# Patient Record
Sex: Female | Born: 1990 | Race: Black or African American | Hispanic: No | Marital: Single | Smoking: Former smoker
Health system: Southern US, Community
[De-identification: ages and names within clinical notes are randomized; demographics above are authoritative.]

## PROBLEM LIST (undated history)

## (undated) ENCOUNTER — Inpatient Hospital Stay (HOSPITAL_COMMUNITY): Payer: Self-pay

## (undated) DIAGNOSIS — N200 Calculus of kidney: Secondary | ICD-10-CM

## (undated) DIAGNOSIS — A599 Trichomoniasis, unspecified: Secondary | ICD-10-CM

## (undated) HISTORY — PX: LITHOTRIPSY: SUR834

---
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
## 2011-03-28 ENCOUNTER — Emergency Department (HOSPITAL_BASED_OUTPATIENT_CLINIC_OR_DEPARTMENT_OTHER)
Admission: EM | Admit: 2011-03-28 | Discharge: 2011-03-28 | Disposition: A | Discharge status: Home / Self Care | Payer: Self-pay | Attending: Emergency Medicine | Admitting: Emergency Medicine

## 2011-03-28 DIAGNOSIS — J40 Bronchitis, not specified as acute or chronic: Secondary | ICD-10-CM | POA: Insufficient documentation

## 2011-07-21 ENCOUNTER — Encounter: Payer: Self-pay | Admitting: *Deleted

## 2011-07-21 ENCOUNTER — Emergency Department (HOSPITAL_BASED_OUTPATIENT_CLINIC_OR_DEPARTMENT_OTHER)
Admission: EM | Admit: 2011-07-21 | Discharge: 2011-07-21 | Disposition: A | Discharge status: Home / Self Care | Payer: Self-pay | Attending: Emergency Medicine | Admitting: Emergency Medicine

## 2011-07-21 DIAGNOSIS — F17200 Nicotine dependence, unspecified, uncomplicated: Secondary | ICD-10-CM | POA: Insufficient documentation

## 2011-07-21 DIAGNOSIS — L509 Urticaria, unspecified: Secondary | ICD-10-CM | POA: Insufficient documentation

## 2011-07-21 HISTORY — DX: Calculus of kidney: N20.0

## 2011-07-21 MED ORDER — DIPHENHYDRAMINE HCL 50 MG/ML IJ SOLN
25.0000 mg | Freq: Once | INTRAMUSCULAR | Status: AC
  Administered 2011-07-21: 50 mg via INTRAVENOUS
  Filled 2011-07-21: qty 1

## 2011-07-21 MED ORDER — FAMOTIDINE IN NACL 20-0.9 MG/50ML-% IV SOLN
20.0000 mg | Freq: Once | INTRAVENOUS | Status: AC
  Administered 2011-07-21: 14:00:00 via INTRAVENOUS
  Administered 2011-07-21: 20 mg via INTRAVENOUS
  Filled 2011-07-21: qty 50

## 2011-07-21 MED ORDER — METHYLPREDNISOLONE SODIUM SUCC 125 MG IJ SOLR
125.0000 mg | Freq: Once | INTRAMUSCULAR | Status: AC
  Administered 2011-07-21: 125 mg via INTRAVENOUS
  Filled 2011-07-21: qty 2

## 2011-07-21 MED ORDER — STERILE WATER FOR INJECTION IV SOLN
INTRAVENOUS | Status: DC
  Administered 2011-07-21 (×2): via INTRAVENOUS

## 2011-07-21 NOTE — ED Notes (Signed)
Pt c/o rash to entire body x 2 days 

## 2011-07-21 NOTE — ED Provider Notes (Signed)
Medical screening examination/treatment/procedure(s) were performed by non-physician practitioner and as supervising physician I was immediately available for consultation/collaboration.    Lyanne Co, MD 07/21/11 [phone]

## 2011-07-21 NOTE — ED Provider Notes (Signed)
History     CSN: [id number] Arrival date & time: 07/21/2011  1:38 PM  Chief Complaint  Patient presents with  . Rash   Patient is a 20 y.o. female presenting with rash. The history is provided by the patient.  Rash  This is a new problem. The current episode started yesterday. The problem has not changed since onset.There has been no fever. The rash is present on the neck, face and right arm. The patient is experiencing no pain. The pain has been constant since onset. Pertinent negatives include no itching. She has tried nothing for the symptoms. Risk factors include new environmental exposures.   Pt has a rash,  Worse on arms and face.  Pt denies any insect bites Past Medical History  Diagnosis Date  . Kidney calculi     Past Surgical History  Procedure Date  . Lithotripsy     History reviewed. No pertinent family history.  History  Substance Use Topics  . Smoking status: Current Some Day Smoker -- 0.5 packs/day  . Smokeless tobacco: Not on file  . Alcohol Use: No    OB History    Grav Para Term Preterm Abortions TAB SAB Ect Mult Living                  Review of Systems  Skin: Positive for rash. Negative for itching.  All other systems reviewed and are negative.    Physical Exam  BP 117/71  Pulse 62  Temp(Src) 98.4 F (36.9 C) (Oral)  Resp 16  Wt 170 lb (77.111 kg)  LMP 07/13/2011  Physical Exam  Nursing note and vitals reviewed. Constitutional: She appears well-developed and well-nourished.  HENT:  Head: Normocephalic and atraumatic.  Eyes: Conjunctivae and EOM are normal. Pupils are equal, round, and reactive to light.  Neck: Normal range of motion. Neck supple.  Cardiovascular: Normal rate.   Pulmonary/Chest: Effort normal.  Abdominal: Soft.  Musculoskeletal: Normal range of motion.  Neurological: She is alert.  Skin: Rash noted.  Psychiatric: She has a normal mood and affect.  Pt has a hive like rash,  Bright red areas  ED Course   Procedures  MDM Pt is here with a friend who has multiple insect bites.      Langston Masker, [place] 07/21/11 1537

## 2011-08-25 LAB — WET PREP, GENITAL
Trich, Wet Prep: NONE SEEN
Yeast Wet Prep HPF POC: NONE SEEN

## 2011-08-25 LAB — POCT URINALYSIS DIP (DEVICE)
Bilirubin Urine: NEGATIVE
Glucose, UA: NEGATIVE
Ketones, ur: NEGATIVE
Nitrite: NEGATIVE
Specific Gravity, Urine: 1.02
pH: 7

## 2011-08-25 LAB — GC/CHLAMYDIA PROBE AMP, GENITAL: Chlamydia, DNA Probe: POSITIVE — AB

## 2012-06-06 ENCOUNTER — Encounter (HOSPITAL_BASED_OUTPATIENT_CLINIC_OR_DEPARTMENT_OTHER): Payer: Self-pay | Admitting: *Deleted

## 2012-06-06 ENCOUNTER — Emergency Department (HOSPITAL_BASED_OUTPATIENT_CLINIC_OR_DEPARTMENT_OTHER)
Admission: EM | Admit: 2012-06-06 | Discharge: 2012-06-06 | Disposition: A | Discharge status: Home / Self Care | Payer: Self-pay | Attending: Emergency Medicine | Admitting: Emergency Medicine

## 2012-06-06 DIAGNOSIS — T148 Other injury of unspecified body region: Secondary | ICD-10-CM | POA: Insufficient documentation

## 2012-06-06 DIAGNOSIS — W57XXXA Bitten or stung by nonvenomous insect and other nonvenomous arthropods, initial encounter: Secondary | ICD-10-CM | POA: Insufficient documentation

## 2012-06-06 MED ORDER — HYDROXYZINE HCL 25 MG PO TABS
25.0000 mg | ORAL_TABLET | Freq: Once | ORAL | Status: AC
  Administered 2012-06-06: 25 mg via ORAL
  Filled 2012-06-06: qty 1

## 2012-06-06 MED ORDER — HYDROXYZINE HCL 25 MG PO TABS: 25.0000 mg | ORAL_TABLET | Freq: Three times a day (TID) | ORAL | Status: AC | PRN

## 2012-06-06 NOTE — ED Notes (Signed)
Ice pack applied.

## 2012-06-06 NOTE — ED Provider Notes (Signed)
History  This chart was scribed for Cyndra Numbers, MD by Erskine Emery. This patient was seen in room MH06/MH06 and the patient's care was started at 22:18.  CSN: [id number]  Arrival date & time 06/06/12  2203   First MD Initiated Contact with Patient 06/06/12 2218      Chief Complaint  Patient presents with  . Urticaria    (Consider location/radiation/quality/duration/timing/severity/associated sxs/prior treatment) HPI  Sandra [name] is a 21 y.o. female who presents to the Emergency Department complaining of an itching rash located on her buttocks, back, LUE, and LLE with associated swelling and pain. Pt denies any associated nausea, vomiting, fevers, or trouble swallowing. Pt reports taking Benadryl at 11am this morning and using cortisone itch cream with no relief of symptoms. Pt reports she spent the night at her grandmother's house and woke up to a rash all over her body. Pt reports she was the only one in the household to wake up with these symptoms. Pt denies her grandmother leaves the windows open and says she has no dog. Pt was driven to ED by a friend. Pt reports no previous episodes of inflamed reactions to insect bites. Pt denies any new topical exposures or environmental exposures.     Past Medical History  Diagnosis Date  . Kidney calculi     Past Surgical History  Procedure Date  . Lithotripsy     History reviewed. No pertinent family history.  History  Substance Use Topics  . Smoking status: Current Some Day Smoker -- 0.5 packs/day  . Smokeless tobacco: Not on file  . Alcohol Use: No    OB History    Grav Para Term Preterm Abortions TAB SAB Ect Mult Living                  Review of Systems  Constitutional: Negative.  Negative for fever.  HENT: Negative.  Negative for trouble swallowing.   Eyes: Negative.   Respiratory: Negative.   Cardiovascular: Negative.   Gastrointestinal: Negative.  Negative for nausea and vomiting.  Genitourinary: Negative.     Musculoskeletal: Negative.   Skin: Positive for rash.  Neurological: Negative.   Hematological: Negative.   Psychiatric/Behavioral: Negative.   All other systems reviewed and are negative.   A complete 10 system review of systems was obtained and all systems are negative except as noted in the HPI and PMH.   Allergies  Review of patient's allergies indicates no known allergies.  Home Medications  No current outpatient prescriptions on file.  Triage Vitals: BP 112/84  Pulse 91  Temp 98.2 F (36.8 C) (Oral)  Resp 20  Ht 5\' 1"  (1.549 m)  Wt 175 lb (79.379 kg)  BMI 33.07 kg/m2  SpO2 100%  LMP 05/30/2012  Physical Exam  Nursing note and vitals reviewed. GEN: Well-developed, well-nourished female in no distress HEENT: Atraumatic, normocephalic.  EYES: PERRLA BL, no scleral icterus. NECK: Trachea midline, no meningismus CV: regular rate and rhythm.  PULM: No respiratory distress.   GI: soft, non-tender. No guarding, rebound, or tenderness. + bowel sounds  GU: deferred Neuro: cranial nerves grossly 2-12 intact, no abnormalities of strength or sensation, A and O x 3 MSK: Patient moves all 4 extremities symmetrically, no deformity, edema, or injury noted Skin: Patient with numerous erythematous lesions with raised 1 cm central lesions consistent with insect bites.  These are noted on the right lower extremity and right upper extremity as well as her right shoulder. Psych: no abnormality of mood  ED Course  Procedures (including critical care time)  DIAGNOSTIC STUDIES: Oxygen Saturation is 100% on room air, normal by my interpretation.    COORDINATION OF CARE: 22:31--I discussed treatment plan including Atarax with pt and pt agreed.  22:30--Medication order: hydroxyzine (Atrax/Vistaril) tablet 25 mg--once  Labs Reviewed - No data to display No results found.   1. Multiple insect bites       MDM  Patient was evaluated by myself. Based on evaluation patient had  numerous bug bites most consistent with mosquito bites. Patient not have any distribution or lesions to suggest a cellulitis violation. Every lesion appeared to have small erythematous raised central area consistent with insect bite. Patient was treated with Atarax here and had improvement in her itching. Patient also was given ice packs and had improvement as well. Patient was discharged in good condition with prescription for Atarax. I no concern for possible abscesses his lesions had fluctuance or induration. Patient had no known allergy exposures. Patient was comfortable with plan and discharged in good condition.  I personally performed the services described in this documentation, which was scribed in my presence. The recorded information has been reviewed and considered.         Cyndra Numbers, MD 06/06/12 [phone]

## 2012-06-06 NOTE — ED Notes (Signed)
Pt has large red whelps to arms, back, and legs since yesterday. Pt states no relief from hydrocortisone cream or benadryl.

## 2013-10-09 ENCOUNTER — Emergency Department (HOSPITAL_BASED_OUTPATIENT_CLINIC_OR_DEPARTMENT_OTHER)
Admission: EM | Admit: 2013-10-09 | Discharge: 2013-10-09 | Disposition: A | Discharge status: Home / Self Care | Payer: Medicaid Other | Attending: Emergency Medicine | Admitting: Emergency Medicine

## 2013-10-09 ENCOUNTER — Encounter (HOSPITAL_BASED_OUTPATIENT_CLINIC_OR_DEPARTMENT_OTHER): Payer: Self-pay | Admitting: Emergency Medicine

## 2013-10-09 DIAGNOSIS — Z3202 Encounter for pregnancy test, result negative: Secondary | ICD-10-CM | POA: Insufficient documentation

## 2013-10-09 DIAGNOSIS — F17200 Nicotine dependence, unspecified, uncomplicated: Secondary | ICD-10-CM | POA: Insufficient documentation

## 2013-10-09 DIAGNOSIS — N63 Unspecified lump in unspecified breast: Secondary | ICD-10-CM | POA: Insufficient documentation

## 2013-10-09 DIAGNOSIS — Z87442 Personal history of urinary calculi: Secondary | ICD-10-CM | POA: Insufficient documentation

## 2013-10-09 MED ORDER — IBUPROFEN 800 MG PO TABS
800.0000 mg | ORAL_TABLET | Freq: Once | ORAL | Status: AC
  Administered 2013-10-09: 800 mg via ORAL
  Filled 2013-10-09: qty 1

## 2013-10-09 NOTE — ED Notes (Signed)
Reports right breast pain x 1 week. Noticed new lump in right breast 1 week ago

## 2013-10-09 NOTE — ED Provider Notes (Signed)
CSN: [id number]     Arrival date & time 10/09/13  1816 History   First MD Initiated Contact with Patient 10/09/13 1921     Chief Complaint  Patient presents with  . Breast Mass   (Consider location/radiation/quality/duration/timing/severity/associated sxs/prior Treatment) The history is provided by the patient. No language interpreter was used.  Pt is a 22 year old female who presents with a lump in her right breast. She reports that it has been there for approx 1 week and she noticed it while doing a self-breast exam. She reports that it is tender to touch. She denies fever, chillls, nausea, vomiting or recent illness. She denies any history of fibrocystic breast disease or breast cancer in her family. She says that her sister had a benign cyst in her breast and had it removed. She reports having her LMP approx 2 weeks ago and has had regular cycles every month. She denies any warmth or erythema, she is not lactating.    Past Medical History  Diagnosis Date  . Kidney calculi    Past Surgical History  Procedure Laterality Date  . Lithotripsy     No family history on file. History  Substance Use Topics  . Smoking status: Current Some Day Smoker -- 0.50 packs/day    Types: Cigarettes  . Smokeless tobacco: Never Used  . Alcohol Use: No   OB History   Grav Para Term Preterm Abortions TAB SAB Ect Mult Living                 Review of Systems  Constitutional: Negative for fever and chills.  Cardiovascular: Negative for chest pain.  Gastrointestinal: Negative for nausea, vomiting and diarrhea.  All other systems reviewed and are negative.    Allergies  Ciprofloxacin  Home Medications  No current outpatient prescriptions on file. BP 126/77  Pulse 77  Temp(Src) 99.1 F (37.3 C) (Oral)  Resp 20  Ht 5\' 2"  (1.575 m)  Wt 149 lb 11.2 oz (67.903 kg)  BMI 27.37 kg/m2  SpO2 100%  LMP 09/23/2013 Physical Exam  Vitals reviewed. Constitutional: She is oriented to person, place,  and time. She appears well-developed and well-nourished.  HENT:  Head: Normocephalic and atraumatic.  Mouth/Throat: Oropharynx is clear and moist.  Eyes: Conjunctivae and EOM are normal. Pupils are equal, round, and reactive to light.  Neck: Normal range of motion. Neck supple.  Cardiovascular: Normal rate, regular rhythm and normal heart sounds.   Pulmonary/Chest: Effort normal and breath sounds normal. No respiratory distress. She has no wheezes.  Abdominal: Soft. Bowel sounds are normal. She exhibits no distension. There is no tenderness.  Musculoskeletal: Normal range of motion.  Neurological: She is alert and oriented to person, place, and time.  Skin: Skin is warm and dry.     Psychiatric: She has a normal mood and affect. Her behavior is normal. Judgment and thought content normal.    ED Course  Procedures (including critical care time) Labs Review Labs Reviewed  PREGNANCY, URINE   Imaging Review No results found.  EKG Interpretation   None       MDM   1. Breast mass in female    2-3cm, round, well-defined, non-fluctuant area at 5 o'clock, right breast. Tender to palpation. No fever, chills, warmth in the area. No discharge from nipple or dimpling noted. Ibuprofen for pain. Follow-up: establish PCP, breast center for diagnostic mammogram vs. Ultrasound. No history of breast cancer in her family, sister had cystic breast. Most likely a macrocyst.  Irish Elders, NP 10/09/13 2001

## 2013-10-09 NOTE — ED Notes (Signed)
Patient c/o pain in her right breast with movement, states that she feels as though there is lump in her breast and that is where she thinks the pain is radiating from.

## 2013-10-12 NOTE — ED Provider Notes (Signed)
Medical screening examination/treatment/procedure(s) were performed by non-physician practitioner and as supervising physician I was immediately available for consultation/collaboration.  EKG Interpretation   None         Launa Goedken W. Makinsey Pepitone, MD 10/12/13 0641 

## 2014-02-24 ENCOUNTER — Encounter (HOSPITAL_COMMUNITY): Payer: Self-pay | Admitting: *Deleted

## 2014-02-24 ENCOUNTER — Inpatient Hospital Stay (HOSPITAL_COMMUNITY): Payer: Medicaid Other

## 2014-02-24 ENCOUNTER — Inpatient Hospital Stay (HOSPITAL_COMMUNITY)
Admission: AD | Admit: 2014-02-24 | Discharge: 2014-02-24 | Disposition: A | Discharge status: Home / Self Care | Payer: Medicaid Other | Source: Ambulatory Visit | Attending: Obstetrics & Gynecology | Admitting: Obstetrics & Gynecology

## 2014-02-24 DIAGNOSIS — O2390 Unspecified genitourinary tract infection in pregnancy, unspecified trimester: Secondary | ICD-10-CM | POA: Insufficient documentation

## 2014-02-24 DIAGNOSIS — A499 Bacterial infection, unspecified: Secondary | ICD-10-CM

## 2014-02-24 DIAGNOSIS — B9689 Other specified bacterial agents as the cause of diseases classified elsewhere: Secondary | ICD-10-CM | POA: Insufficient documentation

## 2014-02-24 DIAGNOSIS — N760 Acute vaginitis: Secondary | ICD-10-CM | POA: Insufficient documentation

## 2014-02-24 DIAGNOSIS — O99330 Smoking (tobacco) complicating pregnancy, unspecified trimester: Secondary | ICD-10-CM | POA: Insufficient documentation

## 2014-02-24 DIAGNOSIS — R109 Unspecified abdominal pain: Secondary | ICD-10-CM | POA: Insufficient documentation

## 2014-02-24 DIAGNOSIS — O26899 Other specified pregnancy related conditions, unspecified trimester: Secondary | ICD-10-CM

## 2014-02-24 HISTORY — DX: Trichomoniasis, unspecified: A59.9

## 2014-02-24 LAB — URINALYSIS, ROUTINE W REFLEX MICROSCOPIC
Bilirubin Urine: NEGATIVE
GLUCOSE, UA: NEGATIVE mg/dL
HGB URINE DIPSTICK: NEGATIVE
Ketones, ur: NEGATIVE mg/dL
LEUKOCYTES UA: NEGATIVE
Nitrite: NEGATIVE
Protein, ur: NEGATIVE mg/dL
SPECIFIC GRAVITY, URINE: 1.01 (ref 1.005–1.030)
UROBILINOGEN UA: 0.2 mg/dL (ref 0.0–1.0)
pH: 7 (ref 5.0–8.0)

## 2014-02-24 LAB — CBC
HEMATOCRIT: 38.5 % (ref 36.0–46.0)
Hemoglobin: 13.4 g/dL (ref 12.0–15.0)
MCH: 34.4 pg — ABNORMAL HIGH (ref 26.0–34.0)
MCHC: 34.8 g/dL (ref 30.0–36.0)
MCV: 98.7 fL (ref 78.0–100.0)
PLATELETS: 333 10*3/uL (ref 150–400)
RBC: 3.9 MIL/uL (ref 3.87–5.11)
RDW: 13 % (ref 11.5–15.5)
WBC: 11 10*3/uL — AB (ref 4.0–10.5)

## 2014-02-24 LAB — HCG, QUANTITATIVE, PREGNANCY: hCG, Beta Chain, Quant, S: 15998 m[IU]/mL — ABNORMAL HIGH (ref <5)

## 2014-02-24 LAB — WET PREP, GENITAL
TRICH WET PREP: NONE SEEN
YEAST WET PREP: NONE SEEN

## 2014-02-24 MED ORDER — METRONIDAZOLE 500 MG PO TABS: 500.0000 mg | ORAL_TABLET | Freq: Two times a day (BID) | ORAL | Status: DC

## 2014-02-24 NOTE — Discharge Instructions (Signed)
Bacterial Vaginosis Bacterial vaginosis is a vaginal infection that occurs when the normal balance of bacteria in the vagina is disrupted. It results from an overgrowth of certain bacteria. This is the most common vaginal infection in women of childbearing age. Treatment is important to prevent complications, especially in pregnant women, as it can cause a premature delivery. CAUSES  Bacterial vaginosis is caused by an increase in harmful bacteria that are normally present in smaller amounts in the vagina. Several different kinds of bacteria can cause bacterial vaginosis. However, the reason that the condition develops is not fully understood. RISK FACTORS Certain activities or behaviors can put you at an increased risk of developing bacterial vaginosis, including:  Having a new sex partner or multiple sex partners.  Douching.  Using an intrauterine device (IUD) for contraception. Women do not get bacterial vaginosis from toilet seats, bedding, swimming pools, or contact with objects around them. SIGNS AND SYMPTOMS  Some women with bacterial vaginosis have no signs or symptoms. Common symptoms include:  Grey vaginal discharge.  A fishlike odor with discharge, especially after sexual intercourse.  Itching or burning of the vagina and vulva.  Burning or pain with urination. DIAGNOSIS  Your health care provider will take a medical history and examine the vagina for signs of bacterial vaginosis. A sample of vaginal fluid may be taken. Your health care provider will look at this sample under a microscope to check for bacteria and abnormal cells. A vaginal pH test may also be done.  TREATMENT  Bacterial vaginosis may be treated with antibiotic medicines. These may be given in the form of a pill or a vaginal cream. A second round of antibiotics may be prescribed if the condition comes back after treatment.  HOME CARE INSTRUCTIONS   Only take over-the-counter or prescription medicines as  directed by your health care provider.  If antibiotic medicine was prescribed, take it as directed. Make sure you finish it even if you start to feel better.  Do not have sex until treatment is completed.  Tell all sexual partners that you have a vaginal infection. They should see their health care provider and be treated if they have problems, such as a mild rash or itching.  Practice safe sex by using condoms and only having one sex partner. SEEK MEDICAL CARE IF:   Your symptoms are not improving after 3 days of treatment.  You have increased discharge or pain.  You have a fever. MAKE SURE YOU:   Understand these instructions.  Will watch your condition.  Will get help right away if you are not doing well or get worse. FOR MORE INFORMATION  Centers for Disease Control and Prevention, Division of STD Prevention: [employer] American Sexual Health Association (ASHA): www.ashastd.org  Document Released: 11/17/2005 Document Revised: 09/07/2013 Document Reviewed: 06/29/2013 [facility] Patient Information 2014 [place].  Pregnancy - First Trimester During sexual intercourse, millions of sperm go into the vagina. Only 1 sperm will penetrate and fertilize the female egg while it is in the Fallopian tube. One week later, the fertilized egg implants into the wall of the uterus. An embryo begins to develop into a baby. At 6 to 8 weeks, the eyes and face are formed and the heartbeat can be seen on ultrasound. At the end of 12 weeks (first trimester), all the baby's organs are formed. Now that you are pregnant, you will want to do everything you can to have a healthy baby. Two of the most important things are to get good  prenatal care and follow your caregiver's instructions. Prenatal care is all the medical care you receive before the baby's birth. It is given to prevent, find, and treat problems during the pregnancy and childbirth. PRENATAL EXAMS  During prenatal visits, your weight,  blood pressure, and urine are checked. This is done to make sure you are healthy and progressing normally during the pregnancy.  A pregnant woman should gain 25 to 35 pounds during the pregnancy. However, if you are overweight or underweight, your caregiver will advise you regarding your weight.  Your caregiver will ask and answer questions for you.  Blood work, cervical cultures, other necessary tests, and a Pap test are done during your prenatal exams. These tests are done to check on your health and the probable health of your baby. Tests are strongly recommended and done for HIV with your permission. This is the virus that causes AIDS. These tests are done because medicines can be given to help prevent your baby from being born with this infection should you have been infected without knowing it. Blood work is also used to find out your blood type, previous infections, and follow your blood levels (hemoglobin).  Low hemoglobin (anemia) is common during pregnancy. Iron and vitamins are given to help prevent this. Later in the pregnancy, blood tests for diabetes will be done along with any other tests if any problems develop.  You may need other tests to make sure you and the baby are doing well. CHANGES DURING THE FIRST TRIMESTER  Your body goes through many changes during pregnancy. They vary from person to person. Talk to your caregiver about changes you notice and are concerned about. Changes can include:  Your menstrual period stops.  The egg and sperm carry the genes that determine what you look like. Genes from you and your partner are forming a baby. The female genes determine whether the baby is a boy or a girl.  Your body increases in girth and you may feel bloated.  Feeling sick to your stomach (nauseous) and throwing up (vomiting). If the vomiting is uncontrollable, call your caregiver.  Your breasts will begin to enlarge and become tender.  Your nipples may stick out more and  become darker.  The need to urinate more. Painful urination may mean you have a bladder infection.  Tiring easily.  Loss of appetite.  Cravings for certain kinds of food.  At first, you may gain or lose a couple of pounds.  You may have changes in your emotions from day to day (excited to be pregnant or concerned something may go wrong with the pregnancy and baby).  You may have more vivid and strange dreams. HOME CARE INSTRUCTIONS   It is very important to avoid all smoking, alcohol and non-prescribed drugs during your pregnancy. These affect the formation and growth of the baby. Avoid chemicals while pregnant to ensure the delivery of a healthy infant.  Start your prenatal visits by the 12th week of pregnancy. They are usually scheduled monthly at first, then more often in the last 2 months before delivery. Keep your caregiver's appointments. Follow your caregiver's instructions regarding medicine use, blood and lab tests, exercise, and diet.  During pregnancy, you are providing food for you and your baby. Eat regular, well-balanced meals. Choose foods such as meat, fish, milk and other low fat dairy products, vegetables, fruits, and whole-grain breads and cereals. Your caregiver will tell you of the ideal weight gain.  You can help morning sickness by keeping  soda crackers at the bedside. Eat a couple before arising in the morning. You may want to use the crackers without salt on them.  Eating 4 to 5 small meals rather than 3 large meals a day also may help the nausea and vomiting.  Drinking liquids between meals instead of during meals also seems to help nausea and vomiting.  A physical sexual relationship may be continued throughout pregnancy if there are no other problems. Problems may be early (premature) leaking of amniotic fluid from the membranes, vaginal bleeding, or belly (abdominal) pain.  Exercise regularly if there are no restrictions. Check with your caregiver or  physical therapist if you are unsure of the safety of some of your exercises. Greater weight gain will occur in the last 2 trimesters of pregnancy. Exercising will help:  Control your weight.  Keep you in shape.  Prepare you for labor and delivery.  Help you lose your pregnancy weight after you deliver your baby.  Wear a good support or jogging bra for breast tenderness during pregnancy. This may help if worn during sleep too.  Ask when prenatal classes are available. Begin classes when they are offered.  Do not use hot tubs, steam rooms, or saunas.  Wear your seat belt when driving. This protects you and your baby if you are in an accident.  Avoid raw meat, uncooked cheese, cat litter boxes, and soil used by cats throughout the pregnancy. These carry germs that can cause birth defects in the baby.  The first trimester is a good time to visit your dentist for your dental health. Getting your teeth cleaned is okay. Use a softer toothbrush and brush gently during pregnancy.  Ask for help if you have financial, counseling, or nutritional needs during pregnancy. Your caregiver will be able to offer counseling for these needs as well as refer you for other special needs.  Do not take any medicines or herbs unless told by your caregiver.  Inform your caregiver if there is any mental or physical domestic violence.  Make a list of emergency phone numbers of family, friends, hospital, and police and fire departments.  Write down your questions. Take them to your prenatal visit.  Do not douche.  Do not cross your legs.  If you have to stand for long periods of time, rotate you feet or take small steps in a circle.  You may have more vaginal secretions that may require a sanitary pad. Do not use tampons or scented sanitary pads. MEDICINES AND DRUG USE IN PREGNANCY  Take prenatal vitamins as directed. The vitamin should contain 1 milligram of folic acid. Keep all vitamins out of reach of  children. Only a couple vitamins or tablets containing iron may be fatal to a baby or young child when ingested.  Avoid use of all medicines, including herbs, over-the-counter medicines, not prescribed or suggested by your caregiver. Only take over-the-counter or prescription medicines for pain, discomfort, or fever as directed by your caregiver. Do not use aspirin, ibuprofen, or naproxen unless directed by your caregiver.  Let your caregiver also know about herbs you may be using.  Alcohol is related to a number of birth defects. This includes fetal alcohol syndrome. All alcohol, in any form, should be avoided completely. Smoking will cause low birth rate and premature babies.  Street or illegal drugs are very harmful to the baby. They are absolutely forbidden. A baby born to an addicted mother will be addicted at birth. The baby will go through the  same withdrawal an adult does. °· Let your caregiver know about any medicines that you have to take and for what reason you take them. °SEEK MEDICAL CARE IF:  °You have any concerns or worries during your pregnancy. It is better to call with your questions if you feel they cannot wait, rather than worry about them. °SEEK IMMEDIATE MEDICAL CARE IF:  °· An unexplained oral temperature above 102° F (38.9° C) develops, or as your caregiver suggests. °· You have leaking of fluid from the vagina (birth canal). If leaking membranes are suspected, take your temperature and inform your caregiver of this when you call. °· There is vaginal spotting or bleeding. Notify your caregiver of the amount and how many pads are used. °· You develop a bad smelling vaginal discharge with a change in the color. °· You continue to feel sick to your stomach (nauseated) and have no relief from remedies suggested. You vomit blood or coffee ground-like materials. °· You lose more than 2 pounds of weight in 1 week. °· You gain more than 2 pounds of weight in 1 week and you notice swelling of  your face, hands, feet, or legs. °· You gain 5 pounds or more in 1 week (even if you do not have swelling of your hands, face, legs, or feet). °· You get exposed to German measles and have never had them. °· You are exposed to fifth disease or chickenpox. °· You develop belly (abdominal) pain. Round ligament discomfort is a common non-cancerous (benign) cause of abdominal pain in pregnancy. Your caregiver still must evaluate this. °· You develop headache, fever, diarrhea, pain with urination, or shortness of breath. °· You fall or are in a car accident or have any kind of trauma. °· There is mental or physical violence in your home. °Document Released: 11/11/2001 Document Revised: 08/11/2012 Document Reviewed: 05/15/2009 °[employer]® Patient Information ©2014 [employer]. ° ° °

## 2014-02-24 NOTE — MAU Note (Signed)
Pt presents with complaints of lower abdominal pain for a couple of weeks. She states she thought she was getting ready to start her cycle but never did and took 2 home pregnancy test with one positive and one negative.

## 2014-02-24 NOTE — MAU Provider Note (Signed)
Chief Complaint: Abdominal Pain   First Provider Initiated Contact with Patient 02/24/14 1825     SUBJECTIVE HPI: Sandra [name] is a 23 y.o. G0P0 at who presents to maternity admissions reporting late menses and abdominal pain x2 weeks.  She reports 1 positive pregnancy test 1 week ago and 1 equivocal/negative test 4 days ago.  Patient's last menstrual period was 01/13/2014.  She reports having a bowel movement yesterday that was hard and uncomfortable, which is common for her.  She denies vaginal bleeding, vaginal itching/burning, urinary symptoms, h/a, dizziness, n/v, or fever/chills.     Past Medical History  Diagnosis Date  . Kidney calculi   . Trichomonas infection    Past Surgical History  Procedure Laterality Date  . Lithotripsy     History   Social History  . Marital Status: Single    Spouse Name: N/A    Number of Children: N/A  . Years of Education: N/A   Occupational History  . Not on file.   Social History Main Topics  . Smoking status: Current Some Day Smoker -- 0.50 packs/day    Types: Cigarettes  . Smokeless tobacco: Never Used  . Alcohol Use: No  . Drug Use: No  . Sexual Activity: Yes    Birth Control/ Protection: Condom   Other Topics Concern  . Not on file   Social History Narrative  . No narrative on file   No current facility-administered medications on file prior to encounter.   No current outpatient prescriptions on file prior to encounter.   Allergies  Allergen Reactions  . Ciprofloxacin Hives    ROS: Pertinent items in HPI  OBJECTIVE Blood pressure 122/64, pulse 102, temperature 98.2 F (36.8 C), temperature source Oral, resp. rate 16, height 5\' 2"  (1.575 m), weight 69.4 kg (153 lb), last menstrual period 01/13/2014. GENERAL: Well-developed, well-nourished female in no acute distress.  HEENT: Normocephalic HEART: normal rate RESP: normal effort ABDOMEN: Soft, non-tender EXTREMITIES: Nontender, no edema NEURO: Alert and  oriented Pelvic exam: Cervix pink, visually closed, without lesion, moderate amount frothy yellow/white discharge, vaginal walls and external genitalia normal Bimanual exam: Cervix 0/long/high, firm, anterior, neg CMT, uterus nontender, nonenlarged, adnexa without tenderness, enlargement, or mass  LAB RESULTS Results for orders placed during the hospital encounter of 02/24/14 (from the past 24 hour(s))  URINALYSIS, ROUTINE W REFLEX MICROSCOPIC     Status: None   Collection Time    02/24/14  5:00 PM      Result Value Ref Range   Color, Urine YELLOW  YELLOW   APPearance CLEAR  CLEAR   Specific Gravity, Urine 1.010  1.005 - 1.030   pH 7.0  5.0 - 8.0   Glucose, UA NEGATIVE  NEGATIVE mg/dL   Hgb urine dipstick NEGATIVE  NEGATIVE   Bilirubin Urine NEGATIVE  NEGATIVE   Ketones, ur NEGATIVE  NEGATIVE mg/dL   Protein, ur NEGATIVE  NEGATIVE mg/dL   Urobilinogen, UA 0.2  0.0 - 1.0 mg/dL   Nitrite NEGATIVE  NEGATIVE   Leukocytes, UA NEGATIVE  NEGATIVE  HCG, QUANTITATIVE, PREGNANCY     Status: Abnormal   Collection Time    02/24/14  5:47 PM      Result Value Ref Range   hCG, Beta Chain, [place] [address] (*) <5 mIU/mL  CBC     Status: Abnormal   Collection Time    02/24/14  5:48 PM      Result Value Ref Range   WBC 11.0 (*) 4.0 - 10.5  K/uL   RBC 3.90  3.87 - 5.11 MIL/uL   Hemoglobin 13.4  12.0 - 15.0 g/dL   HCT 16.138.5  09.636.0 - 04.546.0 %   MCV 98.7  78.0 - 100.0 fL   MCH 34.4 (*) 26.0 - 34.0 pg   MCHC 34.8  30.0 - 36.0 g/dL   RDW 40.913.0  81.111.5 - 91.415.5 %   Platelets 333  150 - 400 K/uL  WET PREP, GENITAL     Status: Abnormal   Collection Time    02/24/14  6:23 PM      Result Value Ref Range   Yeast Wet Prep HPF POC NONE SEEN  NONE SEEN   Trich, Wet Prep NONE SEEN  NONE SEEN   Clue Cells Wet Prep HPF POC MANY (*) NONE SEEN   WBC, Wet Prep HPF POC FEW (*) NONE SEEN    IMAGING [place] Ob Transvaginal  02/24/2014   CLINICAL DATA:  Abdominal pain.  Pregnancy.  EXAM: TRANSVAGINAL OB ULTRASOUND   TECHNIQUE: Transvaginal ultrasound was performed for complete evaluation of the gestation as well as the maternal uterus, adnexal regions, and pelvic cul-de-sac.  COMPARISON:  None.  FINDINGS: Intrauterine gestational sac: Visualized/normal in shape.  Yolk sac:  Yes  Embryo:  Yes  Cardiac Activity: Yes  Heart Rate: 110 bpm  CRL:   4  mm   6 w 1 d                  US EDC: 10/19/2014  Maternal uterus/adnexae: Trace amount of subchorionic hemorrhage.  IMPRESSION: 1. Single living intrauterine pregnancy measuring at 6 weeks 1 day gestation. 2. Trace amount of subchorionic hemorrhage.   Electronically Signed   By: Herbie BaltimoreWalt  Liebkemann M.D.   On: 02/24/2014 20:06    ASSESSMENT 1. Bacterial vaginosis   2. Abdominal pain in pregnancy     PLAN Discharge home F/U with early prenatal care Flagyl 500 mg BID x 7 days Return to MAU as needed for emergencies    Medication List         metroNIDAZOLE 500 MG tablet  Commonly known as:  FLAGYL  Take 1 tablet (500 mg total) by mouth 2 (two) times daily.     nitrofurantoin (macrocrystal-monohydrate) 100 MG capsule  Commonly known as:  MACROBID  Take 100 mg by mouth 2 (two) times daily.       Follow-up Information   [facility] [place]. (Or prenatal provider of your choice.  Return to MAU as needed for emergencies.)    Contact information:   [address] [place] [id number] [phone]      Sharen CounterLisa Leftwich-Kirby Certified Nurse-Midwife 02/24/2014  8:11 PM

## 2014-02-25 LAB — GC/CHLAMYDIA PROBE AMP
CT PROBE, AMP APTIMA: NEGATIVE
GC Probe RNA: NEGATIVE

## 2014-02-27 NOTE — MAU Provider Note (Signed)
Attestation of Attending Supervision of Advanced Practitioner (CNM/NP): Evaluation and management procedures were performed by the Advanced Practitioner under my supervision and collaboration.  I have reviewed the Advanced Practitioner's note and chart, and I agree with the management and plan.  HARRAWAY-SMITH, Cannie Muckle 7:26 AM

## 2014-10-02 ENCOUNTER — Encounter (HOSPITAL_COMMUNITY): Payer: Self-pay | Admitting: *Deleted

## 2014-12-30 ENCOUNTER — Encounter (HOSPITAL_COMMUNITY): Payer: Self-pay | Admitting: *Deleted

## 2015-09-09 ENCOUNTER — Inpatient Hospital Stay (HOSPITAL_COMMUNITY)
Admission: AD | Admit: 2015-09-09 | Discharge: 2015-09-09 | Disposition: A | Discharge status: Home / Self Care | Payer: Self-pay | Source: Ambulatory Visit | Attending: Obstetrics and Gynecology | Admitting: Obstetrics and Gynecology

## 2015-09-09 ENCOUNTER — Encounter (HOSPITAL_COMMUNITY): Payer: Self-pay | Admitting: *Deleted

## 2015-09-09 ENCOUNTER — Inpatient Hospital Stay (HOSPITAL_COMMUNITY): Payer: Medicaid Other

## 2015-09-09 DIAGNOSIS — O3680X0 Pregnancy with inconclusive fetal viability, not applicable or unspecified: Secondary | ICD-10-CM

## 2015-09-09 DIAGNOSIS — O209 Hemorrhage in early pregnancy, unspecified: Secondary | ICD-10-CM | POA: Insufficient documentation

## 2015-09-09 DIAGNOSIS — O3680X1 Pregnancy with inconclusive fetal viability, fetus 1: Secondary | ICD-10-CM

## 2015-09-09 DIAGNOSIS — Z3A01 Less than 8 weeks gestation of pregnancy: Secondary | ICD-10-CM | POA: Insufficient documentation

## 2015-09-09 DIAGNOSIS — O219 Vomiting of pregnancy, unspecified: Secondary | ICD-10-CM

## 2015-09-09 DIAGNOSIS — R112 Nausea with vomiting, unspecified: Secondary | ICD-10-CM | POA: Insufficient documentation

## 2015-09-09 DIAGNOSIS — O26851 Spotting complicating pregnancy, first trimester: Secondary | ICD-10-CM

## 2015-09-09 DIAGNOSIS — O26891 Other specified pregnancy related conditions, first trimester: Secondary | ICD-10-CM | POA: Insufficient documentation

## 2015-09-09 DIAGNOSIS — F17200 Nicotine dependence, unspecified, uncomplicated: Secondary | ICD-10-CM | POA: Insufficient documentation

## 2015-09-09 LAB — WET PREP, GENITAL
Trich, Wet Prep: NONE SEEN
Yeast Wet Prep HPF POC: NONE SEEN

## 2015-09-09 LAB — URINALYSIS, ROUTINE W REFLEX MICROSCOPIC
Bilirubin Urine: NEGATIVE
Glucose, UA: NEGATIVE mg/dL
Hgb urine dipstick: NEGATIVE
KETONES UR: 15 mg/dL — AB
LEUKOCYTES UA: NEGATIVE
NITRITE: NEGATIVE
PROTEIN: NEGATIVE mg/dL
Specific Gravity, Urine: 1.025 (ref 1.005–1.030)
UROBILINOGEN UA: 1 mg/dL (ref 0.0–1.0)
pH: 6.5 (ref 5.0–8.0)

## 2015-09-09 LAB — CBC
HEMATOCRIT: 40.7 % (ref 36.0–46.0)
HEMOGLOBIN: 14.3 g/dL (ref 12.0–15.0)
MCH: 35 pg — ABNORMAL HIGH (ref 26.0–34.0)
MCHC: 35.1 g/dL (ref 30.0–36.0)
MCV: 99.8 fL (ref 78.0–100.0)
Platelets: 348 10*3/uL (ref 150–400)
RBC: 4.08 MIL/uL (ref 3.87–5.11)
RDW: 12.8 % (ref 11.5–15.5)
WBC: 10 10*3/uL (ref 4.0–10.5)

## 2015-09-09 LAB — ABO/RH: ABO/RH(D): O POS

## 2015-09-09 LAB — HIV ANTIBODY (ROUTINE TESTING W REFLEX): HIV Screen 4th Generation wRfx: NONREACTIVE

## 2015-09-09 LAB — HCG, QUANTITATIVE, PREGNANCY: HCG, BETA CHAIN, QUANT, S: 2684 m[IU]/mL — AB (ref <5)

## 2015-09-09 MED ORDER — PROMETHAZINE HCL 25 MG PO TABS: 25.0000 mg | ORAL_TABLET | Freq: Four times a day (QID) | ORAL | Status: AC | PRN

## 2015-09-09 MED ORDER — ONDANSETRON 8 MG PO TBDP
8.0000 mg | ORAL_TABLET | Freq: Once | ORAL | Status: AC
  Administered 2015-09-09: 8 mg via ORAL
  Filled 2015-09-09: qty 1

## 2015-09-09 MED ORDER — OXYCODONE-ACETAMINOPHEN 5-325 MG PO TABS
2.0000 | ORAL_TABLET | Freq: Once | ORAL | Status: AC
  Administered 2015-09-09: 2 via ORAL
  Filled 2015-09-09: qty 2

## 2015-09-09 NOTE — MAU Provider Note (Signed)
History     CSN: [id number]  Arrival date and time: 09/09/15 0865   First Provider Initiated Contact with Patient 09/09/15 0049         Chief Complaint  Patient presents with  . Abdominal Pain  . Vaginal Bleeding   HPI Sandra [name] is a 24 y.o. G2P1001 at [redacted]w[redacted]d by LMP who presents with abdominal pain, n/v, & vaginal bleeding.  Was seen last Monday at [facility]; BHCG was 988 then (per care everywhere). Ultrasound not done on that visit; pt was instructed to f/u in 48 hrs but didn't.  Abdominal pain x 2 weeks; worse today. Currently rates as 9/10. Cramp like pain in lower abdomen. No treatment.  Pink spotting on toilet paper every other day since last week.  N/v x 4 days. States has vomited so much in the last 24 hrs that she couldn't keep count. States unable to keep down food.  Denies vaginal discharge.  Denies diarrhea or constipation.  No urinary complaints.    OB History    Gravida Para Term Preterm AB TAB SAB Ectopic Multiple Living   2 1 1       1       Past Medical History  Diagnosis Date  . Kidney calculi   . Trichomonas infection     Past Surgical History  Procedure Laterality Date  . Lithotripsy      No family history on file.  Social History  Substance Use Topics  . Smoking status: Current Some Day Smoker -- 0.50 packs/day    Types: Cigarettes  . Smokeless tobacco: Never Used  . Alcohol Use: No    Allergies:  Allergies  Allergen Reactions  . Ciprofloxacin Hives    Prescriptions prior to admission  Medication Sig Dispense Refill Last Dose  . metroNIDAZOLE (FLAGYL) 500 MG tablet Take 1 tablet (500 mg total) by mouth 2 (two) times daily. 14 tablet 0   . nitrofurantoin, macrocrystal-monohydrate, (MACROBID) 100 MG capsule Take 100 mg by mouth 2 (two) times daily.   02/23/2014 at Unknown time    Review of Systems  Constitutional: Negative.   Gastrointestinal: Positive for nausea, vomiting and abdominal pain. Negative for diarrhea and  constipation.  Genitourinary: Negative for dysuria.       + vaginal bleeding No vaginal discharge   Physical Exam   Blood pressure 123/72, pulse 70, temperature 98.2 F (36.8 C), resp. rate 18, height 5\' 2"  (1.575 m), weight 176 lb 9.6 oz (80.105 kg), last menstrual period 08/19/2015, unknown if currently breastfeeding.  Physical Exam  Nursing note and vitals reviewed. Constitutional: She is oriented to person, place, and time. She appears well-developed and well-nourished. No distress.  HENT:  Head: Normocephalic and atraumatic.  Eyes: Conjunctivae are normal. Right eye exhibits no discharge. Left eye exhibits no discharge. No scleral icterus.  Neck: Normal range of motion.  Cardiovascular: Normal rate, regular rhythm and normal heart sounds.   No murmur heard. Respiratory: Effort normal and breath sounds normal. No respiratory distress. She has no wheezes.  GI: Soft. Bowel sounds are normal. She exhibits no distension. There is tenderness (tenderness in lower abdomen, worse in RLQ). There is no rebound.  Neurological: She is alert and oriented to person, place, and time.  Skin: Skin is warm and dry. She is not diaphoretic.  Psychiatric: She has a normal mood and affect. Her behavior is normal. Judgment and thought content normal.    MAU Course  Procedures Results for orders placed or performed during the  hospital encounter of 09/09/15 (from the past 24 hour(s))  Urinalysis, Routine w reflex microscopic (not at [facility])     Status: Abnormal   Collection Time: 09/09/15 12:30 AM  Result Value Ref Range   Color, Urine YELLOW YELLOW   APPearance CLEAR CLEAR   Specific Gravity, Urine 1.025 1.005 - 1.030   pH 6.5 5.0 - 8.0   Glucose, UA NEGATIVE NEGATIVE mg/dL   Hgb urine dipstick NEGATIVE NEGATIVE   Bilirubin Urine NEGATIVE NEGATIVE   Ketones, ur 15 (A) NEGATIVE mg/dL   Protein, ur NEGATIVE NEGATIVE mg/dL   Urobilinogen, UA 1.0 0.0 - 1.0 mg/dL   Nitrite NEGATIVE NEGATIVE    Leukocytes, UA NEGATIVE NEGATIVE  CBC     Status: Abnormal   Collection Time: 09/09/15 12:35 AM  Result Value Ref Range   WBC 10.0 4.0 - 10.5 K/uL   RBC 4.08 3.87 - 5.11 MIL/uL   Hemoglobin 14.3 12.0 - 15.0 g/dL   HCT 16.1 09.6 - 04.5 %   MCV 99.8 78.0 - 100.0 fL   MCH 35.0 (H) 26.0 - 34.0 pg   MCHC 35.1 30.0 - 36.0 g/dL   RDW 40.9 81.1 - 91.4 %   Platelets 348 150 - 400 K/uL  ABO/Rh     Status: None (Preliminary result)   Collection Time: 09/09/15 12:35 AM  Result Value Ref Range   ABO/RH(D) O POS   hCG, quantitative, pregnancy     Status: Abnormal   Collection Time: 09/09/15 12:35 AM  Result Value Ref Range   hCG, Beta Chain, Quant, S 2684 (H) <5 mIU/mL  Wet prep, genital     Status: Abnormal   Collection Time: 09/09/15  2:00 AM  Result Value Ref Range   Yeast Wet Prep HPF POC NONE SEEN NONE SEEN   Trich, Wet Prep NONE SEEN NONE SEEN   Clue Cells Wet Prep HPF POC FEW (A) NONE SEEN   WBC, Wet Prep HPF POC MODERATE (A) NONE SEEN   US Ob Comp Less 14 Wks  09/09/2015   CLINICAL DATA:  Bleeding.  Abdominal pain.  EXAM: OBSTETRIC <14 WK [place] AND TRANSVAGINAL OB US  TECHNIQUE: Both transabdominal and transvaginal ultrasound examinations were performed for complete evaluation of the gestation as well as the maternal uterus, adnexal regions, and pelvic cul-de-sac. Transvaginal technique was performed to assess early pregnancy.  COMPARISON:  None.  FINDINGS: Intrauterine gestational sac: None  Yolk sac:  Not  Embryo:  No  Cardiac Activity: None  Heart Rate:   bpm  MSD:   mm    w     d  CRL:    mm    w    d                  [place] EDC:  Maternal uterus/adnexae: 2.5 cm complex left ovarian cyst, probably corpus luteum. Normal right ovary. Trace free pelvic fluid.  IMPRESSION: No visible gestational sac.  Close follow-up is necessary.   Electronically Signed   By: Ellery Plunk M.D.   On: 09/09/2015 01:58   US Ob Transvaginal  09/09/2015   CLINICAL DATA:  Bleeding.  Abdominal pain.  EXAM:  OBSTETRIC <14 WK [place] AND TRANSVAGINAL OB US  TECHNIQUE: Both transabdominal and transvaginal ultrasound examinations were performed for complete evaluation of the gestation as well as the maternal uterus, adnexal regions, and pelvic cul-de-sac. Transvaginal technique was performed to assess early pregnancy.  COMPARISON:  None.  FINDINGS: Intrauterine gestational sac: None  Yolk sac:  Not  Embryo:  No  Cardiac Activity: None  Heart Rate:   bpm  MSD:   mm    w     d  CRL:    mm    w    d                  [place] EDC:  Maternal uterus/adnexae: 2.5 cm complex left ovarian cyst, probably corpus luteum. Normal right ovary. Trace free pelvic fluid.  IMPRESSION: No visible gestational sac.  Close follow-up is necessary.   Electronically Signed   By: Ellery Plunk M.D.   On: 09/09/2015 01:58    MDM BHCG @ [facility] per care everywhere - 988 on 10/3 O positive No vomiting while in MAU  Percocet & zofran - some relief Inappropriate rise in BHCG since 10/3. No visible gestation per ultrasound. Will have pt come back in 48 hrs for BHCG Assessment and Plan  A: 1. Pregnancy of unknown anatomic location   2. Vaginal bleeding in pregnancy, first trimester   3. Nausea and vomiting in pregnancy prior to [redacted] weeks gestation    P: Discharge home Return in 48hrs for [facility] Ectopic precautions given Rx phenergan GC/CT & HIV pending  Judeth Horn, NP  09/09/2015, 12:35 AM

## 2015-09-09 NOTE — Discharge Instructions (Signed)
Abdominal Pain During Pregnancy Abdominal pain is common in pregnancy. Most of the time, it does not cause harm. There are many causes of abdominal pain. Some causes are more serious than others. Some of the causes of abdominal pain in pregnancy are easily diagnosed. Occasionally, the diagnosis takes time to understand. Other times, the cause is not determined. Abdominal pain can be a sign that something is very wrong with the pregnancy, or the pain may have nothing to do with the pregnancy at all. For this reason, always tell your health care provider if you have any abdominal discomfort. HOME CARE INSTRUCTIONS  Monitor your abdominal pain for any changes. The following actions may help to alleviate any discomfort you are experiencing:  Do not have sexual intercourse or put anything in your vagina until your symptoms go away completely.  Get plenty of rest until your pain improves.  Drink clear fluids if you feel nauseous. Avoid solid food as long as you are uncomfortable or nauseous.  Only take over-the-counter or prescription medicine as directed by your health care provider.  Keep all follow-up appointments with your health care provider. SEEK IMMEDIATE MEDICAL CARE IF:  You are bleeding, leaking fluid, or passing tissue from the vagina.  You have increasing pain or cramping.  You have persistent vomiting.  You have painful or bloody urination.  You have a fever.  You notice a decrease in your baby's movements.  You have extreme weakness or feel faint.  You have shortness of breath, with or without abdominal pain.  You develop a severe headache with abdominal pain.  You have abnormal vaginal discharge with abdominal pain.  You have persistent diarrhea.  You have abdominal pain that continues even after rest, or gets worse. MAKE SURE YOU:   Understand these instructions.  Will watch your condition.  Will get help right away if you are not doing well or get worse.     This information is not intended to replace advice given to you by your health care provider. Make sure you discuss any questions you have with your health care provider.   Document Released: 11/17/2005 Document Revised: 09/07/2013 Document Reviewed: 06/16/2013 Elsevier Interactive Patient Education [employer]   Ectopic Pregnancy An ectopic pregnancy happens when a fertilized egg grows outside the uterus. A pregnancy cannot live outside of the uterus. This problem often happens in the fallopian tube. It is often caused by damage to the fallopian tube. If this problem is found early, you may be treated with medicine. If your tube tears or bursts open (ruptures), you will bleed inside. This is an emergency. You will need surgery. Get help right away.  SYMPTOMS You may have normal pregnancy symptoms at first. These include:  Missing your period.  Feeling sick to your stomach (nauseous).  Being tired.  Having tender breasts. Then, you may start to have symptoms that are not normal. These include:  Pain with sex (intercourse).  Bleeding from the vagina. This includes light bleeding (spotting).  Belly (abdomen) or lower belly cramping or pain. This may be felt on one side.  A fast heartbeat (pulse).  Passing out (fainting) after going poop (bowel movement). If your tube tears, you may have symptoms such as:  Really bad pain in the belly or lower belly. This happens suddenly.  Dizziness.  Passing out.  Shoulder pain. GET HELP RIGHT AWAY IF:  You have any of these symptoms. This is an emergency. MAKE SURE YOU:  Understand these instructions.  Will  watch your condition.  Will get help right away if you are not doing well or get worse.   This information is not intended to replace advice given to you by your health care provider. Make sure you discuss any questions you have with your health care provider.   Document Released: 02/13/2009 Document Revised: 11/22/2013  Document Reviewed: 06/29/2013 Elsevier Interactive Patient Education 2016 [employer].    Threatened Miscarriage A threatened miscarriage is when you have vaginal bleeding during your first 20 weeks of pregnancy but the pregnancy has not ended. Your doctor will do tests to make sure you are still pregnant. The cause of the bleeding may not be known. This condition does not mean your pregnancy will end. It does increase the risk of it ending (complete miscarriage). HOME CARE   Make sure you keep all your doctor visits for prenatal care.  Get plenty of rest.  Do not have sex or use tampons if you have vaginal bleeding.  Do not douche.  Do not smoke or use drugs.  Do not drink alcohol.  Avoid caffeine. GET HELP IF:  You have light bleeding from your vagina.  You have belly pain or cramping.  You have a fever. GET HELP RIGHT AWAY IF:   You have heavy bleeding from your vagina.  You have clots of blood coming from your vagina.  You have bad pain or cramps in your low back or belly.  You have fever, chills, and bad belly pain. MAKE SURE YOU:   Understand these instructions.  Will watch your condition.  Will get help right away if you are not doing well or get worse.   This information is not intended to replace advice given to you by your health care provider. Make sure you discuss any questions you have with your health care provider.   Document Released: 10/30/2008 Document Revised: 11/22/2013 Document Reviewed: 09/13/2013 Elsevier Interactive Patient Education [employer]

## 2015-09-09 NOTE — MAU Note (Signed)
Having pain in lower abd and R side for about 10 days. Pink spotting off and on. Seen at [facility] Monday and told was pregnant and to return in 2 days but did not go back. Pain worse and has n/v and unable to keep down anything.

## 2015-09-09 NOTE — Progress Notes (Signed)
Erin Lawrence NP in earlier to discuss test results and d/c plan. Written and verbal d/c instructions given and understanding voiced 

## 2015-09-10 LAB — GC/CHLAMYDIA PROBE AMP (~~LOC~~) NOT AT ARMC
CHLAMYDIA, DNA PROBE: POSITIVE — AB
NEISSERIA GONORRHEA: NEGATIVE

## 2015-09-11 ENCOUNTER — Telehealth: Payer: Self-pay | Admitting: *Deleted

## 2015-09-11 DIAGNOSIS — A749 Chlamydial infection, unspecified: Secondary | ICD-10-CM

## 2015-09-11 MED ORDER — AZITHROMYCIN 500 MG PO TABS: ORAL_TABLET | ORAL | Status: DC

## 2015-09-11 NOTE — Telephone Encounter (Signed)
Telephone call to patient regarding positive chlamydia culture, patient notified.  Rx routed to patient's pharmacy per protocol.  Instructed patient to notify her partner and to abstain from sex for seven days post treatment.  Report of treatment faxed to health department.

## 2015-09-12 ENCOUNTER — Inpatient Hospital Stay (HOSPITAL_COMMUNITY): Payer: Medicaid Other

## 2015-09-12 ENCOUNTER — Inpatient Hospital Stay (HOSPITAL_COMMUNITY)
Admission: AD | Admit: 2015-09-12 | Discharge: 2015-09-12 | Disposition: A | Discharge status: Home / Self Care | Payer: Self-pay | Source: Ambulatory Visit | Attending: Obstetrics and Gynecology | Admitting: Obstetrics and Gynecology

## 2015-09-12 ENCOUNTER — Encounter (HOSPITAL_COMMUNITY): Payer: Self-pay | Admitting: *Deleted

## 2015-09-12 DIAGNOSIS — Z3A01 Less than 8 weeks gestation of pregnancy: Secondary | ICD-10-CM | POA: Insufficient documentation

## 2015-09-12 DIAGNOSIS — O009 Unspecified ectopic pregnancy without intrauterine pregnancy: Secondary | ICD-10-CM | POA: Insufficient documentation

## 2015-09-12 DIAGNOSIS — O4690 Antepartum hemorrhage, unspecified, unspecified trimester: Secondary | ICD-10-CM | POA: Insufficient documentation

## 2015-09-12 DIAGNOSIS — O001 Tubal pregnancy without intrauterine pregnancy: Secondary | ICD-10-CM

## 2015-09-12 DIAGNOSIS — R1030 Lower abdominal pain, unspecified: Secondary | ICD-10-CM | POA: Insufficient documentation

## 2015-09-12 LAB — COMPREHENSIVE METABOLIC PANEL
ALBUMIN: 3.4 g/dL — AB (ref 3.5–5.0)
ALK PHOS: 72 U/L (ref 38–126)
ALT: 8 U/L — ABNORMAL LOW (ref 14–54)
AST: 13 U/L — AB (ref 15–41)
Anion gap: 5 (ref 5–15)
BILIRUBIN TOTAL: 0.4 mg/dL (ref 0.3–1.2)
BUN: 9 mg/dL (ref 6–20)
CALCIUM: 9 mg/dL (ref 8.9–10.3)
CO2: 28 mmol/L (ref 22–32)
CREATININE: 0.6 mg/dL (ref 0.44–1.00)
Chloride: 104 mmol/L (ref 101–111)
GFR calc Af Amer: >60 mL/min (ref >60)
GLUCOSE: 104 mg/dL — AB (ref 65–99)
Potassium: 4.2 mmol/L (ref 3.5–5.1)
Sodium: 137 mmol/L (ref 135–145)
TOTAL PROTEIN: 6.9 g/dL (ref 6.5–8.1)

## 2015-09-12 LAB — CBC WITH DIFFERENTIAL/PLATELET
BASOS ABS: 0 10*3/uL (ref 0.0–0.1)
BASOS PCT: 0 %
Eosinophils Absolute: 0.3 10*3/uL (ref 0.0–0.7)
Eosinophils Relative: 3 %
HEMATOCRIT: 39.5 % (ref 36.0–46.0)
HEMOGLOBIN: 13.5 g/dL (ref 12.0–15.0)
LYMPHS PCT: 34 %
Lymphs Abs: 3.1 10*3/uL (ref 0.7–4.0)
MCH: 34.6 pg — ABNORMAL HIGH (ref 26.0–34.0)
MCHC: 34.2 g/dL (ref 30.0–36.0)
MCV: 101.3 fL — AB (ref 78.0–100.0)
MONO ABS: 0.9 10*3/uL (ref 0.1–1.0)
Monocytes Relative: 9 %
NEUTROS ABS: 4.9 10*3/uL (ref 1.7–7.7)
NEUTROS PCT: 54 %
Platelets: 369 10*3/uL (ref 150–400)
RBC: 3.9 MIL/uL (ref 3.87–5.11)
RDW: 13 % (ref 11.5–15.5)
WBC: 9.2 10*3/uL (ref 4.0–10.5)

## 2015-09-12 LAB — HCG, QUANTITATIVE, PREGNANCY: HCG, BETA CHAIN, QUANT, S: 4123 m[IU]/mL — AB (ref <5)

## 2015-09-12 MED ORDER — METHOTREXATE INJECTION FOR WOMEN'S HOSPITAL
50.0000 mg/m2 | Freq: Once | INTRAMUSCULAR | Status: AC
  Administered 2015-09-12: 95 mg via INTRAMUSCULAR
  Filled 2015-09-12: qty 1.9

## 2015-09-12 NOTE — MAU Provider Note (Signed)
S: 24 y.o. G2P1001 @[redacted]w[redacted]d  by LMP presents to MAU for repeat hcg.  She presented 10/9 to MAU with vaginal bleeding in pregnancy.  Bleeding continues, heavier today than 3 days ago when she was seen, requiring pantyliners only, changing every 2 hours.  She denies abdominal pain today.   Her quant hcg on 10/9 was 2684 and ultrasound showed no IUP.  Vaginal Bleeding The patient's primary symptoms include vaginal bleeding. The patient's pertinent negatives include no pelvic pain. This is a recurrent problem. The current episode started in the past 7 days. The problem occurs constantly. The problem has been gradually worsening. The patient is experiencing no pain. She is pregnant. Pertinent negatives include no abdominal pain, constipation, diarrhea, fever, frequency, nausea, urgency or vomiting. The vaginal bleeding is lighter than menses. She has not been passing clots. She has not been passing tissue. Nothing aggravates the symptoms. She has tried nothing for the symptoms.   O: BP 124/70 mmHg  Pulse 62  Temp(Src) 98.4 F (36.9 C) (Oral)  Resp 16  SpO2 100%  LMP 08/19/2015  Physical Examination:  VS reviewed, nursing note reviewed,  Constitutional: well developed, well nourished, no distress HEENT: normocephalic CV: normal rate Pulm/chest wall: normal effort Abdomen: soft Neuro: alert and oriented x 3 Skin: warm, dry Psych: affect normal    Results for orders placed or performed during the hospital encounter of 09/12/15 (from the past 24 hour(s))  hCG, quantitative, pregnancy     Status: Abnormal   Collection Time: 09/12/15  7:43 PM  Result Value Ref Range   hCG, Beta Chain, Quant, S 4123 (H) <5 mIU/mL  CBC WITH DIFFERENTIAL     Status: Abnormal   Collection Time: 09/12/15  7:47 PM  Result Value Ref Range   WBC 9.2 4.0 - 10.5 K/uL   RBC 3.90 3.87 - 5.11 MIL/uL   Hemoglobin 13.5 12.0 - 15.0 g/dL   HCT 16.139.5 09.636.0 - 04.546.0 %   MCV 101.3 (H) 78.0 - 100.0 fL   MCH 34.6 (H) 26.0 - 34.0  pg   MCHC 34.2 30.0 - 36.0 g/dL   RDW 40.913.0 81.111.5 - 91.415.5 %   Platelets 369 150 - 400 K/uL   Neutrophils Relative % 54 %   Neutro Abs 4.9 1.7 - 7.7 K/uL   Lymphocytes Relative 34 %   Lymphs Abs 3.1 0.7 - 4.0 K/uL   Monocytes Relative 9 %   Monocytes Absolute 0.9 0.1 - 1.0 K/uL   Eosinophils Relative 3 %   Eosinophils Absolute 0.3 0.0 - 0.7 K/uL   Basophils Relative 0 %   Basophils Absolute 0.0 0.0 - 0.1 K/uL  Comprehensive metabolic panel     Status: Abnormal   Collection Time: 09/12/15  7:47 PM  Result Value Ref Range   Sodium 137 135 - 145 mmol/L   Potassium 4.2 3.5 - 5.1 mmol/L   Chloride 104 101 - 111 mmol/L   CO2 28 22 - 32 mmol/L   Glucose, Bld 104 (H) 65 - 99 mg/dL   BUN 9 6 - 20 mg/dL   Creatinine, Ser 7.820.60 0.44 - 1.00 mg/dL   Calcium 9.0 8.9 - 95.610.3 mg/dL   Total Protein 6.9 6.5 - 8.1 g/dL   Albumin 3.4 (L) 3.5 - 5.0 g/dL   AST 13 (L) 15 - 41 U/L   ALT 8 (L) 14 - 54 U/L   Alkaline Phosphatase 72 38 - 126 U/L   Total Bilirubin 0.4 0.3 - 1.2 mg/dL   GFR calc  non Af Amer >60 >60 mL/min   GFR calc Af Amer >60 >60 mL/min   Anion gap 5 5 - 15    US Ob Comp Less 14 Wks  09/09/2015  CLINICAL DATA:  Bleeding.  Abdominal pain. EXAM: OBSTETRIC <14 WK [place] AND TRANSVAGINAL OB US TECHNIQUE: Both transabdominal and transvaginal ultrasound examinations were performed for complete evaluation of the gestation as well as the maternal uterus, adnexal regions, and pelvic cul-de-sac. Transvaginal technique was performed to assess early pregnancy. COMPARISON:  None. FINDINGS: Intrauterine gestational sac: None Yolk sac:  Not Embryo:  No Cardiac Activity: None Heart Rate:   bpm MSD:   mm    w     d CRL:    mm    w    d                  [place] EDC: Maternal uterus/adnexae: 2.5 cm complex left ovarian cyst, probably corpus luteum. Normal right ovary. Trace free pelvic fluid. IMPRESSION: No visible gestational sac.  Close follow-up is necessary. Electronically Signed   By: Ellery Plunk M.D.   On:  09/09/2015 01:58   US Ob Transvaginal  09/09/2015  CLINICAL DATA:  Bleeding.  Abdominal pain. EXAM: OBSTETRIC <14 WK [place] AND TRANSVAGINAL OB US TECHNIQUE: Both transabdominal and transvaginal ultrasound examinations were performed for complete evaluation of the gestation as well as the maternal uterus, adnexal regions, and pelvic cul-de-sac. Transvaginal technique was performed to assess early pregnancy. COMPARISON:  None. FINDINGS: Intrauterine gestational sac: None Yolk sac:  Not Embryo:  No Cardiac Activity: None Heart Rate:   bpm MSD:   mm    w     d CRL:    mm    w    d                  [place] EDC: Maternal uterus/adnexae: 2.5 cm complex left ovarian cyst, probably corpus luteum. Normal right ovary. Trace free pelvic fluid. IMPRESSION: No visible gestational sac.  Close follow-up is necessary. Electronically Signed   By: Ellery Plunk M.D.   On: 09/09/2015 01:58   MDM: Labs ordered and reviewed.  Quant hcg increased by slightly less than double in 3 days.  Pt was unable to come to MAU yesterday for 48 hour repeat of quant hcg.  Elevated quant hcg with no confirmed IUP is suspicious for ectopic.  Repeat US today.  Report to Thressa Sheller, CNM 2245: D/W Dr. Emelda Fear, candidate for MTX.  D/W the patient at length. Questions answered. Aware of follow up plan, and agrees to follow up care.   LEFTWICH-KIRBY, LISA, CNM  Assessment/Plan  1. Ectopic pregnancy   2. Abdominal pain, lower   3. Vaginal bleeding in pregnancy    DC home Comfort measures reviewed  Ectopic precautions RX: none  Return to MAU as needed   Follow-up Information    Follow up with [facility] [place] [facility].   Why:  SATURDAY 09/15/15    Contact information:   [address] [id number] mc [place] [address] [phone]      Tawnya Crook 09/12/2015 10:59 PM

## 2015-09-12 NOTE — MAU Note (Signed)
Pt reports her bleeding increased after her visit on Sunday and she was unable to keep her follow up appointment on Tuesday. States she is only using 2 pantyliners per day. Lower abd pain off/on.

## 2015-09-12 NOTE — Discharge Instructions (Signed)

## 2015-09-17 ENCOUNTER — Inpatient Hospital Stay (HOSPITAL_COMMUNITY)
Admission: AD | Admit: 2015-09-17 | Discharge: 2015-09-17 | Disposition: A | Discharge status: Home / Self Care | Payer: Medicaid Other | Source: Ambulatory Visit | Attending: Family Medicine | Admitting: Family Medicine

## 2015-09-17 ENCOUNTER — Inpatient Hospital Stay (HOSPITAL_COMMUNITY): Payer: Medicaid Other

## 2015-09-17 DIAGNOSIS — O009 Unspecified ectopic pregnancy without intrauterine pregnancy: Secondary | ICD-10-CM | POA: Insufficient documentation

## 2015-09-17 LAB — HCG, QUANTITATIVE, PREGNANCY: HCG, BETA CHAIN, QUANT, S: 2242 m[IU]/mL — AB (ref <5)

## 2015-09-17 MED ORDER — OXYCODONE-ACETAMINOPHEN 5-325 MG PO TABS: 1.0000 | ORAL_TABLET | Freq: Four times a day (QID) | ORAL | Status: DC | PRN

## 2015-09-17 NOTE — Discharge Instructions (Signed)
Methotrexate Treatment for an Ectopic Pregnancy, Care After °Refer to this sheet in the next few weeks. These instructions provide you with information on caring for yourself after your procedure. Your health care provider may also give you more specific instructions. Your treatment has been planned according to current medical practices, but problems sometimes occur. Call your health care provider if you have any problems or questions after your procedure. °WHAT TO EXPECT AFTER THE PROCEDURE °You may have some abdominal cramping, vaginal bleeding, and fatigue in the first few days after taking methotrexate. Some other possible side effects of methotrexate include: °· Nausea. °· Vomiting. °· Diarrhea. °· Mouth sores. °· Swelling or irritation of the lining of your lungs (pneumonitis). °· Liver damage. °· Hair loss. °HOME CARE INSTRUCTIONS  °After you have received the methotrexate medicine, you need to be careful of your activities and watch your condition for several weeks. It may take 1 week before your hormone levels return to normal. °· Keep all follow-up appointments as directed by your health care provider. °· Avoid traveling too far away from your health care provider. °· Do not have sexual intercourse until your health care provider says it is safe to do so. °· You may resume your usual diet. °· Limit strenuous activity. °· Do not take folic acid, prenatal vitamins, or other vitamins that contain folic acid. °· Do not take aspirin, ibuprofen, or naproxen (nonsteroidal anti-inflammatory drugs [NSAIDs]). °· Do not drink alcohol. °SEEK MEDICAL CARE IF:  °· You cannot control your nausea and vomiting. °· You cannot control your diarrhea. °· You have sores in your mouth and want treatment. °· You need pain medicine for your abdominal pain. °· You have a rash. °· You are having a reaction to the medicine. °SEEK IMMEDIATE MEDICAL CARE IF:  °· You have increasing abdominal or pelvic pain. °· You notice increased  bleeding. °· You feel light-headed, or you faint. °· You have shortness of breath. °· Your heart rate increases. °· You have a cough. °· You have chills. °· You have a fever. °  °This information is not intended to replace advice given to you by your health care provider. Make sure you discuss any questions you have with your health care provider. °  °Document Released: 11/06/2011 Document Revised: 11/22/2013 Document Reviewed: 09/05/2013 °Elsevier Interactive Patient Education ©2016 [employer] Inc[employer] °Ectopic Pregnancy °An ectopic pregnancy happens when a fertilized egg grows outside the uterus. A pregnancy cannot live outside of the uterus. This problem often happens in the fallopian tube. It is often caused by damage to the fallopian tube. °If this problem is found early, you may be treated with medicine. If your tube tears or bursts open (ruptures), you will bleed inside. This is an emergency. You will need surgery. Get help right away.  °SYMPTOMS °You may have normal pregnancy symptoms at first. These include: °· Missing your period. °· Feeling sick to your stomach (nauseous). °· Being tired. °· Having tender breasts. °Then, you may start to have symptoms that are not normal. These include: °· Pain with sex (intercourse). °· Bleeding from the vagina. This includes light bleeding (spotting). °· Belly (abdomen) or lower belly cramping or pain. This may be felt on one side. °· A fast heartbeat (pulse). °· Passing out (fainting) after going poop (bowel movement). °If your tube tears, you may have symptoms such as: °· Really bad pain in the belly or lower belly. This happens suddenly. °· Dizziness. °· Passing out. °· Shoulder pain. °GET HELP   RIGHT AWAY IF:  °You have any of these symptoms. This is an emergency. °MAKE SURE YOU: °· Understand these instructions. °· Will watch your condition. °· Will get help right away if you are not doing well or get worse. °  °This information is not intended to replace advice given to  you by your health care provider. Make sure you discuss any questions you have with your health care provider. °  °Document Released: 02/13/2009 Document Revised: 11/22/2013 Document Reviewed: 06/29/2013 °Elsevier Interactive Patient Education ©2016 [employer]vier Inc. ° °

## 2015-09-17 NOTE — MAU Note (Signed)
Pt here for F/U BHCG.   Pt states she is continuing to have lower abd pain, still bleeding.

## 2015-09-17 NOTE — MAU Provider Note (Signed)
History   [id number]   Chief Complaint  Patient presents with  . Follow-up  . Ectopic Pregnancy    HPI Sandra [name] is a 24 y.o. female G2P1001 here for follow-up BHCG.  Patient was given methotrexate on 10/12 for 2.3 cm left adnexal mass. BHCG on that day was 4123. Pt was to return on 10/15 for day 4 BHCG but was unable to come in on that day d/t being out of town for a family emergency.  Reports vaginal bleeding like she's on her period & worsening abdominal pain. RLQ pain that she rates as 9/10. Has not treated.   Patient's last menstrual period was 08/19/2015.  OB History  Gravida Para Term Preterm AB SAB TAB Ectopic Multiple Living  2 1 1       1     # Outcome Date GA Lbr Len/2nd Weight Sex Delivery Anes PTL Lv  2 Current           1 Term 10/12/14    F Vag-Spont   Y      Past Medical History  Diagnosis Date  . Kidney calculi   . Trichomonas infection     No family history on file.  Social History   Social History  . Marital Status: Single    Spouse Name: N/A  . Number of Children: N/A  . Years of Education: N/A   Social History Main Topics  . Smoking status: Current Some Day Smoker -- 0.50 packs/day    Types: Cigarettes  . Smokeless tobacco: Never Used  . Alcohol Use: No  . Drug Use: No  . Sexual Activity: Yes    Birth Control/ Protection: Condom   Other Topics Concern  . Not on file   Social History Narrative    Allergies  Allergen Reactions  . Ciprofloxacin Hives    No current facility-administered medications on file prior to encounter.   Current Outpatient Prescriptions on File Prior to Encounter  Medication Sig Dispense Refill  . azithromycin (ZITHROMAX) 500 MG tablet Take two tablets by mouth once. 2 tablet 0  . promethazine (PHENERGAN) 25 MG tablet Take 1 tablet (25 mg total) by mouth every 6 (six) hours as needed for nausea or vomiting. 30 tablet 0     Physical Exam   Filed Vitals:   09/17/15 1903  BP: 118/58  Pulse: 89   Temp: 98.2 F (36.8 C)  TempSrc: Oral  Resp: 18    Physical Exam  Constitutional: She is oriented to person, place, and time. She appears well-developed and well-nourished. No distress.  Cardiovascular: Normal rate.   Respiratory: Effort normal. No respiratory distress.  GI: Soft. There is no tenderness.  Neurological: She is alert and oriented to person, place, and time.  Skin: She is not diaphoretic.  Psychiatric: She has a normal mood and affect. Her behavior is normal. Judgment and thought content normal.    MAU Course  Procedures Component     Latest Ref Rng 09/12/2015 09/17/2015  HCG, Beta Chain, Quant, S     <5 mIU/mL 4123 (H) 2242 (H)   [place] Ob Comp Less 14 Wks  09/17/2015  CLINICAL DATA:  Known left adnexal ectopic pregnancy, now with right lower quadrant pain and vaginal bleeding. Status post methotrexate. EXAM: OBSTETRIC <14 WK ULTRASOUND TECHNIQUE: Transabdominal ultrasound was performed for evaluation of the gestation as well as the maternal uterus and adnexal regions. COMPARISON:  Obstetric ultrasound 09/12/2015 FINDINGS: Intrauterine gestational sac: Not present. Yolk sac:  Not present. Embryo:  Not present. Maternal uterus/adnexae: The uterus appears normal, the endometrium is not well-defined. In the left adnexa adjacent to the left ovary is a complex mass measuring 2.5 x 1.5 x 1.7 cm, most concerning for ectopic pregnancy. This is unchanged compared to prior exam. There is no surrounding free fluid. The adjacent left ovary measures 2.5 x 1.5 x 2.2 cm and contains a 1.8 cm corpus luteal cyst. Right ovary appears normal measuring 3.0 x 1.5 x 1.3 cm. IMPRESSION: Complex left adnexal lesion adjacent to the left ovary concerning for ectopic pregnancy, unchanged in appearance compared to recent prior exam. There is no surrounding free fluid or findings of hemorrhage. Electronically Signed   By: Rubye Oaks M.D.   On: 09/17/2015 20:09    MDM 2022- C/w Dr. Adrian Blackwater.  Discussed BHCG, ultrasound results, and patient's pain. Ok to discharge home d/t significant drop in BHCG. F/u in clinic in 1 week. Rx percocet.   Had thorough discussion with patient regarding importance of keeping f/u appointment. Reiterated s/s of ruptured ectopic & reasons to return to MAU or local ED IMMEDIATELY!!   Assessment and Plan  24 y.o. G2P1001 at [redacted]w[redacted]d wks Pregnancy Follow-up BHCG Ectopic pregnancy  P: Discharge home Ectopic precautions Rx percocet F/u next week in Clinic  Judeth Horn, NP 09/17/2015 7:05 PM

## 2015-09-26 ENCOUNTER — Ambulatory Visit (INDEPENDENT_AMBULATORY_CARE_PROVIDER_SITE_OTHER): Payer: Medicaid Other | Admitting: Obstetrics & Gynecology

## 2015-09-26 ENCOUNTER — Encounter: Payer: Self-pay | Admitting: Obstetrics & Gynecology

## 2015-09-26 VITALS — BP 108/62 | HR 82 | Temp 98.6°F | Ht 62.0 in | Wt 175.6 lb

## 2015-09-26 DIAGNOSIS — O00109 Unspecified tubal pregnancy without intrauterine pregnancy: Secondary | ICD-10-CM

## 2015-09-26 DIAGNOSIS — O001 Tubal pregnancy without intrauterine pregnancy: Secondary | ICD-10-CM

## 2015-09-26 NOTE — Patient Instructions (Signed)
Methotrexate Treatment for an Ectopic Pregnancy °Methotrexate is a medicine that treats ectopic pregnancy by stopping the growth of the fertilized egg. It also helps your body absorb tissue from the egg. This takes between 2 weeks and 6 weeks. Most ectopic pregnancies can be successfully treated with methotrexate if they are detected early enough. °LET YOUR HEALTH CARE PROVIDER KNOW ABOUT: °· Any allergies you have. °· All medicines you are taking, including vitamins, herbs, eye drops, creams, and over-the-counter medicines. °· Medical conditions you have. °RISKS AND COMPLICATIONS °Generally, this is a safe treatment. However, as with any treatment, problems can occur. Possible problems or side effects include: °· Nausea. °· Vomiting. °· Diarrhea. °· Abdominal cramping. °· Mouth sores. °· Increased vaginal bleeding or spotting.   °· Swelling or irritation of the lining of your lungs (pneumonitis).  °· Failed treatment and continuation of the pregnancy.   °· Liver damage. °· Hair loss. °There is still a risk of the ectopic pregnancy rupturing while using the methotrexate. °BEFORE THE PROCEDURE °Before you take the medicine:  °· Liver tests, kidney tests, and a complete blood test are performed. °· Blood tests are performed to measure the pregnancy hormone levels and to determine your blood type. °· If you are Rh-negative and the father is Rh-positive or his Rh type is not known, you will be given a Rho (D) immune globulin shot. °PROCEDURE  °There are two methods that your health care provider may use to prescribe methotrexate. One method involves a single dose or injection of the medicine. Another method involves a series of doses given through several injections.  °AFTER THE PROCEDURE °· You may have some abdominal cramping, vaginal bleeding, and fatigue in the first few days after taking methotrexate. °· Blood tests will be taken for several weeks to check the pregnancy hormone levels. The blood tests are performed  until there is no more pregnancy hormone detected in the blood. °  °This information is not intended to replace advice given to you by your health care provider. Make sure you discuss any questions you have with your health care provider. °  °Document Released: 11/11/2001 Document Revised: 12/08/2014 Document Reviewed: 09/05/2013 °Elsevier Interactive Patient Education ©2016 Elsevier Inc. ° °

## 2015-09-26 NOTE — Progress Notes (Signed)
Patient ID: Sandra [name], female   DOB: 04/30/1991, 24 y.o.   MRN: [id number]  Chief Complaint  Patient presents with  . Follow-up  had MTX treatment for ectopic  HPI Sandra [name] is a [age] y.o. female.  G2P1001 Patient'[name] last menstrual period was 08/19/2015. [name] MTX for ectopic pregnancy, for f/u.  Has No pain and only spotting blood  HPI  Past Medical History  Diagnosis Date  . Kidney calculi   . Trichomonas infection     Past Surgical History  Procedure Laterality Date  . Lithotripsy      History reviewed. No pertinent family history.  Social History Social History  Substance Use Topics  . Smoking status: Current Some Day Smoker -- 0.50 packs/day    Types: Cigarettes  . Smokeless tobacco: Never Used  . Alcohol Use: No    Allergies  Allergen Reactions  . Ciprofloxacin Hives    Current Outpatient Prescriptions  Medication Sig Dispense Refill  . oxyCODONE-acetaminophen (PERCOCET/ROXICET) 5-325 MG tablet Take 1-2 tablets by mouth every 6 (six) hours as needed for severe pain. 15 tablet 0  . promethazine (PHENERGAN) 25 MG tablet Take 1 tablet (25 mg total) by mouth every 6 (six) hours as needed for nausea or vomiting. 30 tablet 0   No current facility-administered medications for this visit.    Review of Systems Review of Systems  Genitourinary: Positive for vaginal bleeding (spotting). Negative for vaginal discharge, menstrual problem and pelvic pain.    Blood pressure 108/62, pulse 82, temperature 98.6 F (37 C), height 5\' 2"  (1.575 m), weight 175 lb 9.6 oz (79.652 kg), last menstrual period 08/19/2015, unknown if currently breastfeeding.  Physical Exam Physical Exam  Constitutional: She is oriented to person, place, and time. She appears well-developed.  Pulmonary/Chest: Effort normal.  Neurological: She is alert and oriented to person, place, and time.  Skin: Skin is warm and dry.  Psychiatric: She has a normal mood and affect. Her behavior is  normal.    Data Reviewed Results for Sandra [name], Sandra [name] (MRN [id number]) as of 09/26/2015 13:57  Ref. Range 09/17/2015 18:25  HCG, Beta Chain, Quant, [name] Latest Ref Range: <5 mIU/mL 2242 (H)   Results for Sandra [name], Sandra [name] (MRN [id number]) as of 09/26/2015 13:57  Ref. Range 09/12/2015 19:43  HCG, Beta Chain, Quant, [name] Latest Ref Range: <5 mIU/mL 4123 (H)   Assessment    [name] MTX for ectopic pregnancy needs f/u HCG Doing well no sx of rupture     Plan    HCG quant now, may need repeat to 0 value Will discuss BCM at f/u        ARNOLD,JAMES 09/26/2015, 1:55 PM

## 2015-09-27 LAB — HCG, QUANTITATIVE, PREGNANCY: hCG, Beta Chain, Quant, S: 447.7 m[IU]/mL — ABNORMAL HIGH

## 2015-10-03 ENCOUNTER — Other Ambulatory Visit: Payer: Self-pay

## 2015-10-12 ENCOUNTER — Telehealth: Payer: Self-pay | Admitting: *Deleted

## 2015-10-12 NOTE — Telephone Encounter (Signed)
Patient needs a repeat beta hcg per Dr Debroah LoopArnold. Attempted to call, no answer, voice mail left asking her to return our call at the clinic.

## 2015-10-17 ENCOUNTER — Encounter: Payer: Self-pay | Admitting: *Deleted

## 2015-10-17 NOTE — Telephone Encounter (Signed)
Certified letter to patient

## 2016-02-26 ENCOUNTER — Inpatient Hospital Stay (HOSPITAL_COMMUNITY)
Admission: AD | Admit: 2016-02-26 | Discharge: 2016-02-26 | Disposition: A | Discharge status: Home / Self Care | Payer: Medicaid - Out of State | Source: Ambulatory Visit | Attending: Family Medicine | Admitting: Family Medicine

## 2016-02-26 ENCOUNTER — Encounter (HOSPITAL_COMMUNITY): Payer: Self-pay | Admitting: *Deleted

## 2016-02-26 ENCOUNTER — Inpatient Hospital Stay (HOSPITAL_COMMUNITY): Payer: Medicaid - Out of State

## 2016-02-26 DIAGNOSIS — O209 Hemorrhage in early pregnancy, unspecified: Secondary | ICD-10-CM | POA: Diagnosis not present

## 2016-02-26 DIAGNOSIS — O23591 Infection of other part of genital tract in pregnancy, first trimester: Secondary | ICD-10-CM | POA: Diagnosis not present

## 2016-02-26 DIAGNOSIS — R109 Unspecified abdominal pain: Secondary | ICD-10-CM

## 2016-02-26 DIAGNOSIS — Z87442 Personal history of urinary calculi: Secondary | ICD-10-CM | POA: Diagnosis not present

## 2016-02-26 DIAGNOSIS — A499 Bacterial infection, unspecified: Secondary | ICD-10-CM

## 2016-02-26 DIAGNOSIS — Z3A09 9 weeks gestation of pregnancy: Secondary | ICD-10-CM | POA: Diagnosis not present

## 2016-02-26 DIAGNOSIS — Z87891 Personal history of nicotine dependence: Secondary | ICD-10-CM | POA: Insufficient documentation

## 2016-02-26 DIAGNOSIS — N760 Acute vaginitis: Secondary | ICD-10-CM

## 2016-02-26 DIAGNOSIS — N939 Abnormal uterine and vaginal bleeding, unspecified: Secondary | ICD-10-CM | POA: Diagnosis present

## 2016-02-26 DIAGNOSIS — B9689 Other specified bacterial agents as the cause of diseases classified elsewhere: Secondary | ICD-10-CM | POA: Diagnosis not present

## 2016-02-26 DIAGNOSIS — O26899 Other specified pregnancy related conditions, unspecified trimester: Secondary | ICD-10-CM

## 2016-02-26 LAB — URINE MICROSCOPIC-ADD ON: Bacteria, UA: NONE SEEN

## 2016-02-26 LAB — URINALYSIS, ROUTINE W REFLEX MICROSCOPIC
BILIRUBIN URINE: NEGATIVE
Glucose, UA: NEGATIVE mg/dL
Hgb urine dipstick: NEGATIVE
Ketones, ur: NEGATIVE mg/dL
NITRITE: NEGATIVE
PH: 7.5 (ref 5.0–8.0)
Protein, ur: NEGATIVE mg/dL
SPECIFIC GRAVITY, URINE: 1.02 (ref 1.005–1.030)

## 2016-02-26 LAB — CBC
HCT: 37.7 % (ref 36.0–46.0)
Hemoglobin: 13.4 g/dL (ref 12.0–15.0)
MCH: 35.4 pg — AB (ref 26.0–34.0)
MCHC: 35.5 g/dL (ref 30.0–36.0)
MCV: 99.7 fL (ref 78.0–100.0)
Platelets: 346 10*3/uL (ref 150–400)
RBC: 3.78 MIL/uL — AB (ref 3.87–5.11)
RDW: 13.1 % (ref 11.5–15.5)
WBC: 6.7 10*3/uL (ref 4.0–10.5)

## 2016-02-26 LAB — WET PREP, GENITAL
Sperm: NONE SEEN
Trich, Wet Prep: NONE SEEN
YEAST WET PREP: NONE SEEN

## 2016-02-26 LAB — POCT PREGNANCY, URINE: Preg Test, Ur: POSITIVE — AB

## 2016-02-26 LAB — HCG, QUANTITATIVE, PREGNANCY: HCG, BETA CHAIN, QUANT, S: 122212 m[IU]/mL — AB (ref <5)

## 2016-02-26 MED ORDER — METRONIDAZOLE 500 MG PO TABS: 500.0000 mg | ORAL_TABLET | Freq: Two times a day (BID) | ORAL | Status: AC

## 2016-02-26 NOTE — Progress Notes (Signed)
Susan Lineberry NP in earlier to discuss test results and d/c plan. Written and verbal d/c instructions given and understanding voiced. 

## 2016-02-26 NOTE — MAU Note (Signed)
Patient states she started cramping and spotting 2-3 days ago. Wearing pantyliner but not changing frequently. Had +home pregnancy test in Feb 2017.

## 2016-02-26 NOTE — Discharge Instructions (Signed)

## 2016-02-26 NOTE — MAU Provider Note (Signed)
History     CSN: [id number]  Arrival date and time: 02/26/16 1625   First Provider Initiated Contact with Patient 02/26/16 1654      Chief Complaint  Patient presents with  . Abdominal Cramping  . Vaginal Bleeding   HPIpt is ?[redacted]weeks pregnant [id number] who presents with spotting in pregnancy with cramping that started 2 days ago. RN note:  Expand All Collapse All   Patient states she started cramping and spotting 2-3 days ago. Wearing pantyliner but not changing frequently. Had +home pregnancy test in Feb 2017.          Past Medical History  Diagnosis Date  . Kidney calculi   . Trichomonas infection     Past Surgical History  Procedure Laterality Date  . Lithotripsy      History reviewed. No pertinent family history.  Social History  Substance Use Topics  . Smoking status: Former Smoker -- 0.50 packs/day    Types: Cigarettes    Quit date: 12/25/2015  . Smokeless tobacco: Never Used  . Alcohol Use: No    Allergies:  Allergies  Allergen Reactions  . Ciprofloxacin Hives    Prescriptions prior to admission  Medication Sig Dispense Refill Last Dose  . oxyCODONE-acetaminophen (PERCOCET/ROXICET) 5-325 MG tablet Take 1-2 tablets by mouth every 6 (six) hours as needed for severe pain. 15 tablet 0 Taking  . promethazine (PHENERGAN) 25 MG tablet Take 1 tablet (25 mg total) by mouth every 6 (six) hours as needed for nausea or vomiting. 30 tablet 0 Taking    Review of Systems  Constitutional: Negative for fever and chills.  Respiratory: Positive for cough.   Gastrointestinal: Positive for nausea, vomiting and abdominal pain. Negative for diarrhea and constipation.  Genitourinary: Negative for dysuria.  Musculoskeletal: Negative for back pain.  Neurological: Negative for headaches.   Physical Exam   Blood pressure 120/66, pulse 63, temperature 98.7 F (37.1 C), temperature source Oral, resp. rate 18, height 5\' 2"  (1.575 m), last menstrual period 12/25/2015, SpO2  98 %, unknown if currently breastfeeding.  Physical Exam  Nursing note and vitals reviewed. Constitutional: She is oriented to person, place, and time. She appears well-developed and well-nourished. She appears distressed.  HENT:  Head: Normocephalic.  Eyes: Pupils are equal, round, and reactive to light.  Neck: Normal range of motion. Neck supple.  Cardiovascular: Normal rate.   Respiratory: Effort normal.  GI: Soft.  Genitourinary:  Small amount of white thin discharg ein vault; cervix closed NT- no bleeding noted; uterus 8-10 weeks size  Musculoskeletal: Normal range of motion.  Neurological: She is alert and oriented to person, place, and time.  Skin: Skin is warm and dry.  Psychiatric: She has a normal mood and affect.    MAU Course  Procedures Results for orders placed or performed during the hospital encounter of 02/26/16 (from the past 24 hour(s))  Urinalysis, Routine w reflex microscopic (not at [facility])     Status: Abnormal   Collection Time: 02/26/16  4:33 PM  Result Value Ref Range   Color, Urine YELLOW YELLOW   APPearance HAZY (A) CLEAR   Specific Gravity, Urine 1.020 1.005 - 1.030   pH 7.5 5.0 - 8.0   Glucose, UA NEGATIVE NEGATIVE mg/dL   Hgb urine dipstick NEGATIVE NEGATIVE   Bilirubin Urine NEGATIVE NEGATIVE   Ketones, ur NEGATIVE NEGATIVE mg/dL   Protein, ur NEGATIVE NEGATIVE mg/dL   Nitrite NEGATIVE NEGATIVE   Leukocytes, UA TRACE (A) NEGATIVE  Urine microscopic-add on  Status: Abnormal   Collection Time: 02/26/16  4:33 PM  Result Value Ref Range   Squamous Epithelial / LPF 0-5 (A) NONE SEEN   WBC, UA 6-30 0 - 5 WBC/hpf   RBC / HPF 0-5 0 - 5 RBC/hpf   Bacteria, UA NONE SEEN NONE SEEN   Trichomonas, UA PRESENT   Pregnancy, urine POC     Status: Abnormal   Collection Time: 02/26/16  4:52 PM  Result Value Ref Range   Preg Test, Ur POSITIVE (A) NEGATIVE  Wet prep, genital     Status: Abnormal   Collection Time: 02/26/16  5:03 PM  Result Value Ref Range    Yeast Wet Prep HPF POC NONE SEEN NONE SEEN   Trich, Wet Prep NONE SEEN NONE SEEN   Clue Cells Wet Prep HPF POC PRESENT (A) NONE SEEN   WBC, Wet Prep HPF POC FEW (A) NONE SEEN   Sperm NONE SEEN   CBC     Status: Abnormal   Collection Time: 02/26/16  5:22 PM  Result Value Ref Range   WBC 6.7 4.0 - 10.5 K/uL   RBC 3.78 (L) 3.87 - 5.11 MIL/uL   Hemoglobin 13.4 12.0 - 15.0 g/dL   HCT 16.1 09.6 - 04.5 %   MCV 99.7 78.0 - 100.0 fL   MCH 35.4 (H) 26.0 - 34.0 pg   MCHC 35.5 30.0 - 36.0 g/dL   RDW 40.9 81.1 - 91.4 %   Platelets 346 150 - 400 K/uL  hCG, quantitative, pregnancy     Status: Abnormal   Collection Time: 02/26/16  5:22 PM  Result Value Ref Range   hCG, Beta Chain, Quant, S [id number] (H) <5 mIU/mL  US Ob Comp Less 14 Wks  02/26/2016  CLINICAL DATA:  Abdominal pain and spotting in first trimester pregnancy EXAM: OBSTETRIC <14 WK ULTRASOUND TECHNIQUE: Transabdominal ultrasound was performed for evaluation of the gestation as well as the maternal uterus and adnexal regions. COMPARISON:  None. FINDINGS: Intrauterine gestational sac: Visualized/normal in shape. Yolk sac:  Present Embryo:  Present Cardiac Activity: Present Heart Rate: 174 bpm CRL:   18  mm   8 w 2 d                  [place] EDC: 10/05/2016 Subchorionic hemorrhage:  None visualized. Maternal uterus/adnexae: Ovaries appear normal.  No free fluid. IMPRESSION: Live intrauterine gestation on transabdominal scanning. Patient declined transvaginal. Electronically Signed   By: Esperanza Heir M.D.   On: 02/26/2016 17:52  GC/chlamydia and HIV pending  Assessment and Plan  Bleeding in pregnancy Bacterial vaginosis- Rx Flagyl  BID for 7 days Viable [redacted]w[redacted]d pregnancy F/u with OB care- pt wants to come to [facility] 02/26/2016, 4:58 PM

## 2016-02-27 LAB — HIV ANTIBODY (ROUTINE TESTING W REFLEX): HIV Screen 4th Generation wRfx: NONREACTIVE

## 2016-02-27 LAB — GC/CHLAMYDIA PROBE AMP (~~LOC~~) NOT AT ARMC
CHLAMYDIA, DNA PROBE: POSITIVE — AB
NEISSERIA GONORRHEA: NEGATIVE

## 2016-02-29 ENCOUNTER — Encounter (HOSPITAL_COMMUNITY): Payer: Self-pay | Admitting: *Deleted

## 2016-03-11 ENCOUNTER — Telehealth (HOSPITAL_COMMUNITY): Payer: Self-pay | Admitting: Family Medicine

## 2016-03-11 DIAGNOSIS — A749 Chlamydial infection, unspecified: Secondary | ICD-10-CM

## 2016-03-11 MED ORDER — AZITHROMYCIN 500 MG PO TABS: ORAL_TABLET | ORAL | Status: AC

## 2016-03-11 NOTE — Telephone Encounter (Signed)
Patient returned call, notified her of positive chlamydia culture.  Rx routed to pharmacy per protocol.  Instructed patient to notify her partner for treatment and to abstain from sex for seven days post treatment.  Report faxed to health department.  

## 2016-12-31 ENCOUNTER — Encounter (HOSPITAL_COMMUNITY): Payer: Self-pay

## 2017-04-16 IMAGING — US US OB COMP LESS 14 WK
1 series · 15 of 28 positions shown · non-contrast
Comparison: None.

CLINICAL DATA: Bleeding.  Abdominal pain.

EXAM:
OBSTETRIC <14 WK US AND TRANSVAGINAL OB US
TECHNIQUE: Both transabdominal and transvaginal ultrasound examinations were
performed for complete evaluation of the gestation as well as the
maternal uterus, adnexal regions, and pelvic cul-de-sac.
Transvaginal technique was performed to assess early pregnancy.

[Series 1: us ob comp less 14 wk · 15 of 52 slices shown]
[im 1/52]
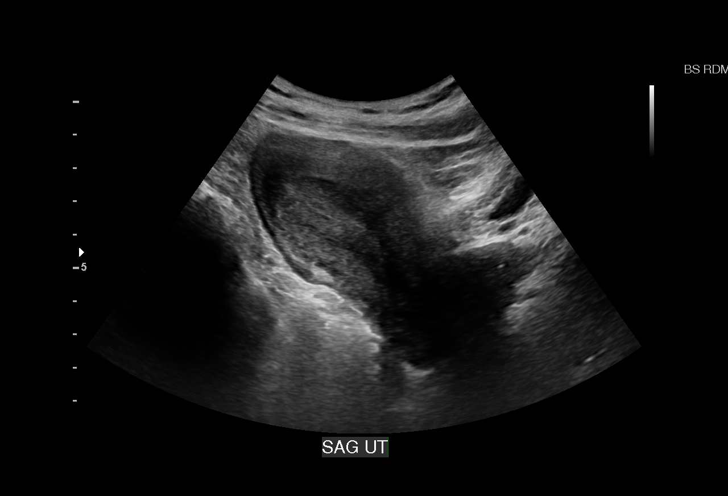
[im 4/52]
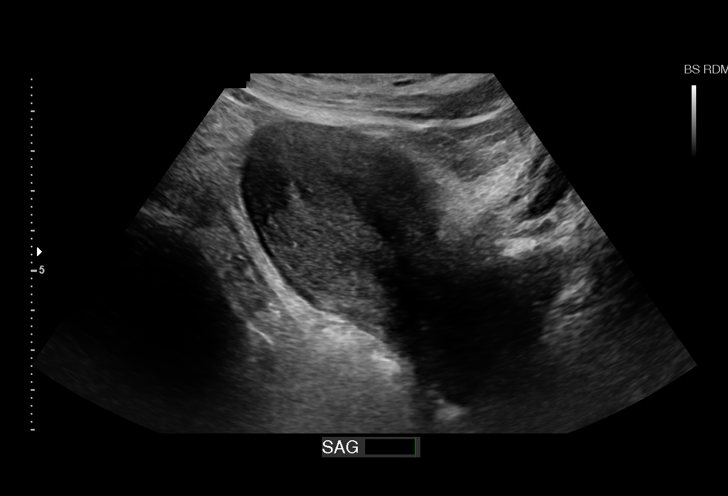
[im 8/52]
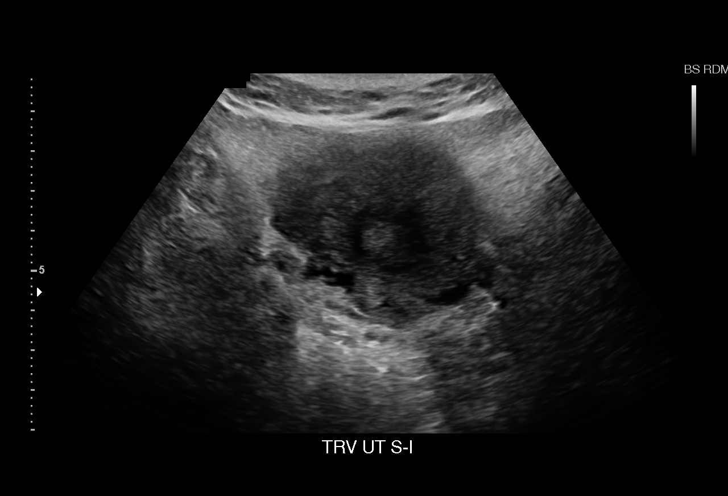
[im 12/52]
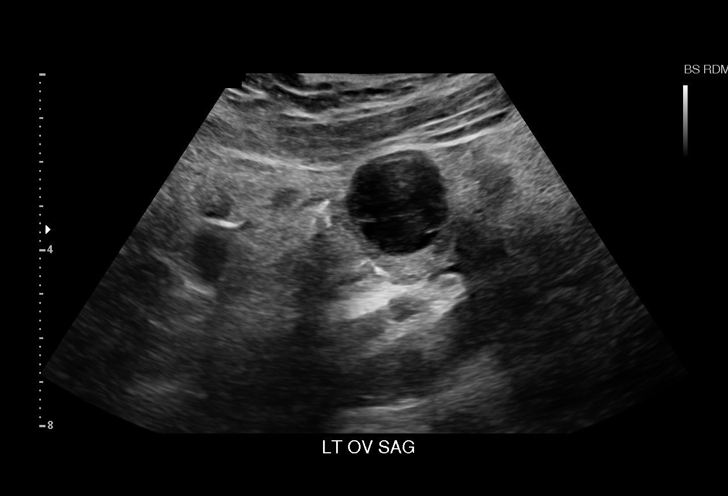
[im 16/52]
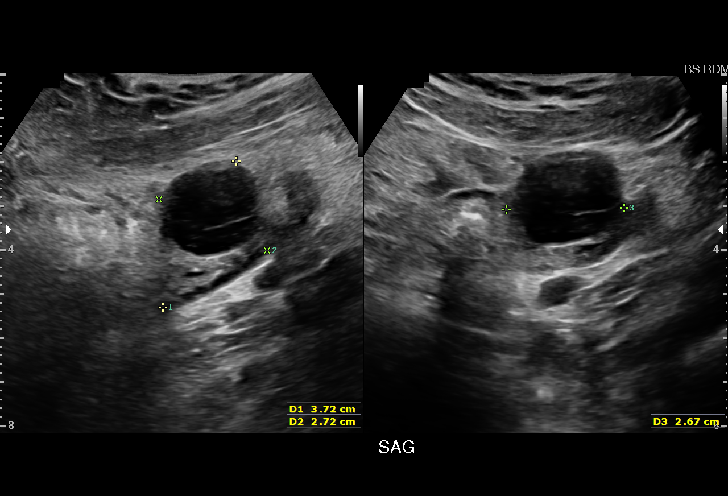
[im 19/52]
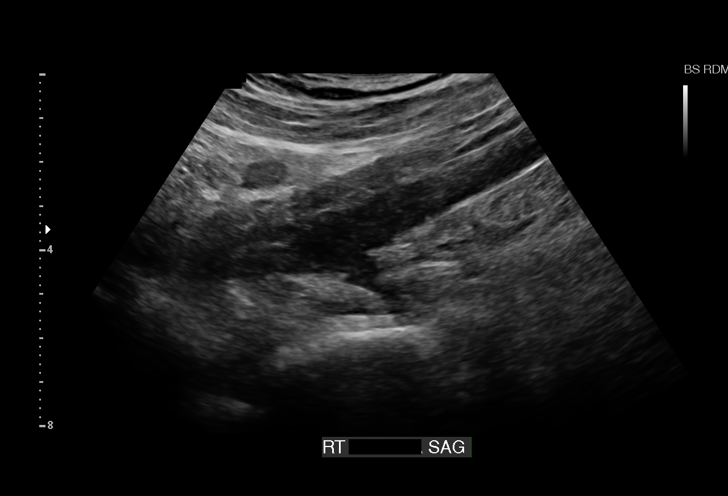
[im 23/52]
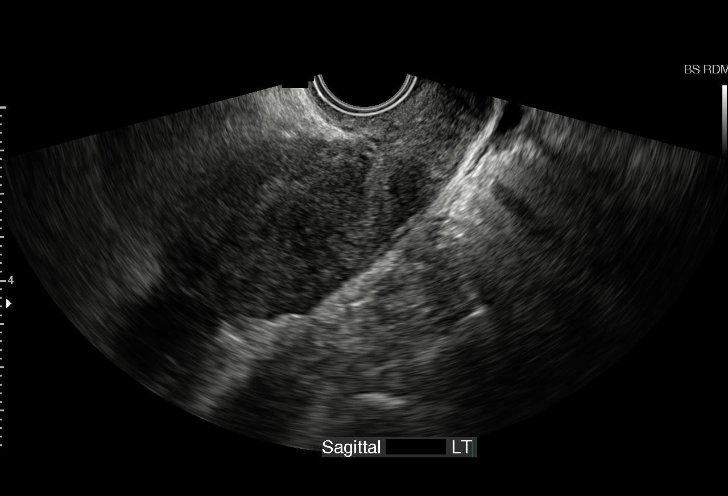
[im 27/52]
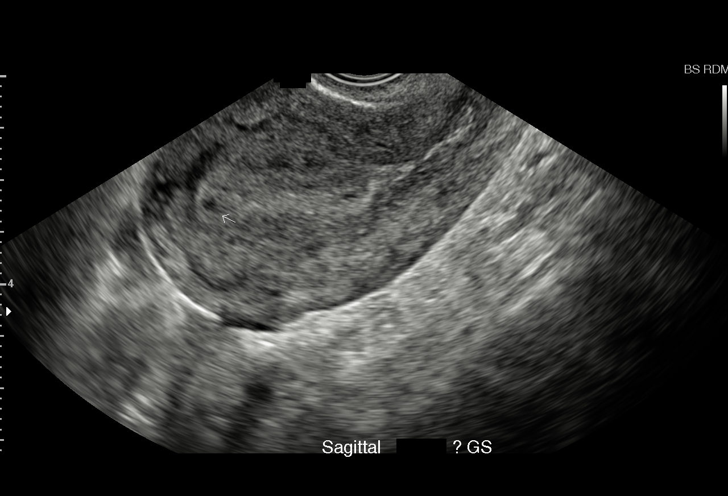
[im 29/52]
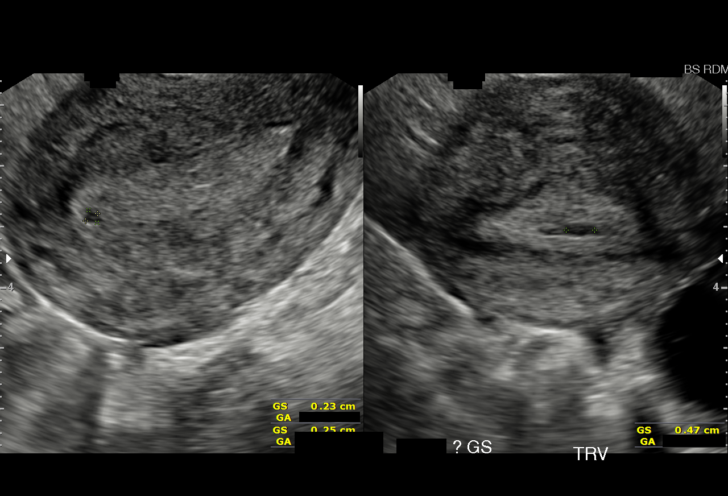
[im 33/52]
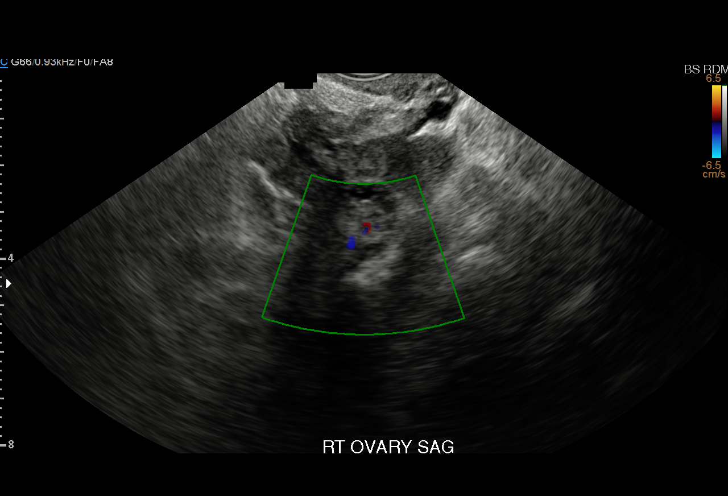
[im 36/52]
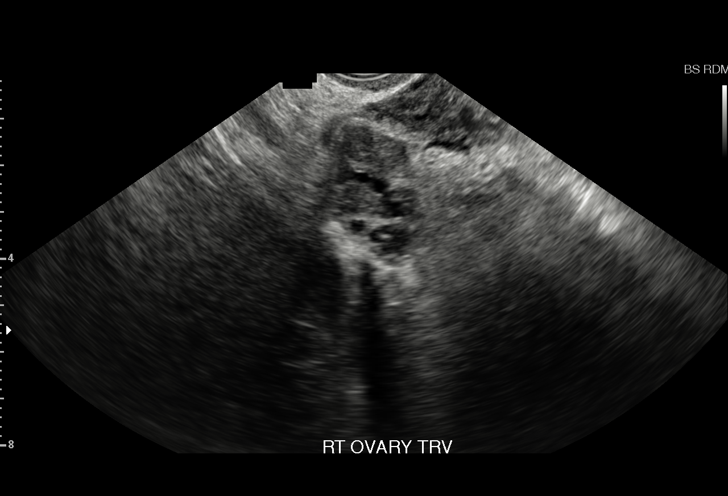
[im 40/52]
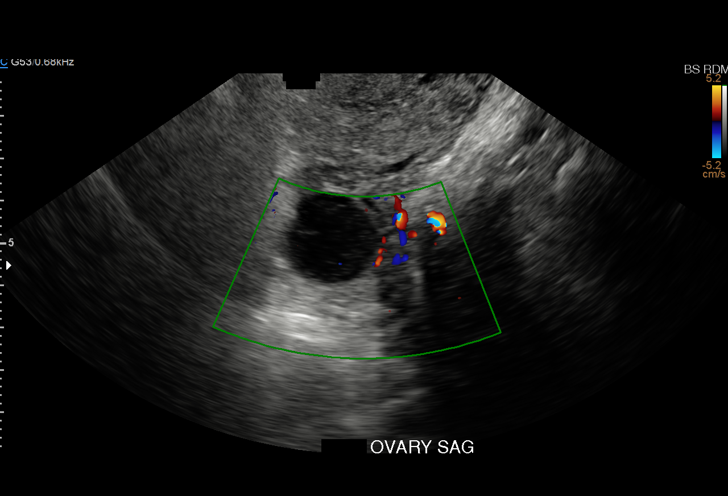
[im 44/52]
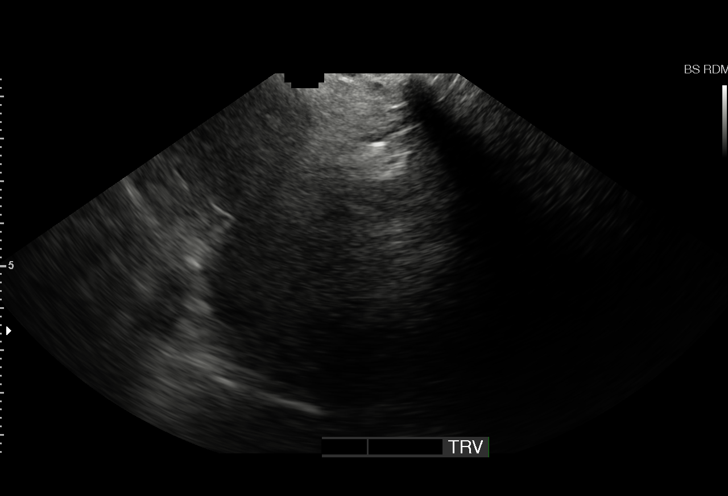
[im 48/52]
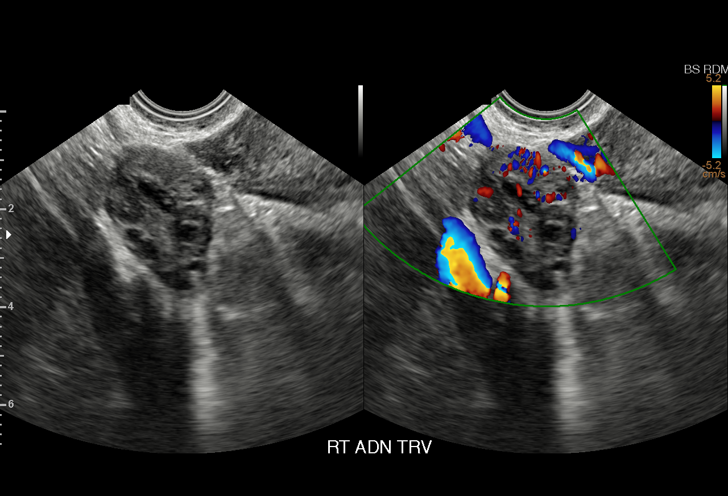
[im 52/52]
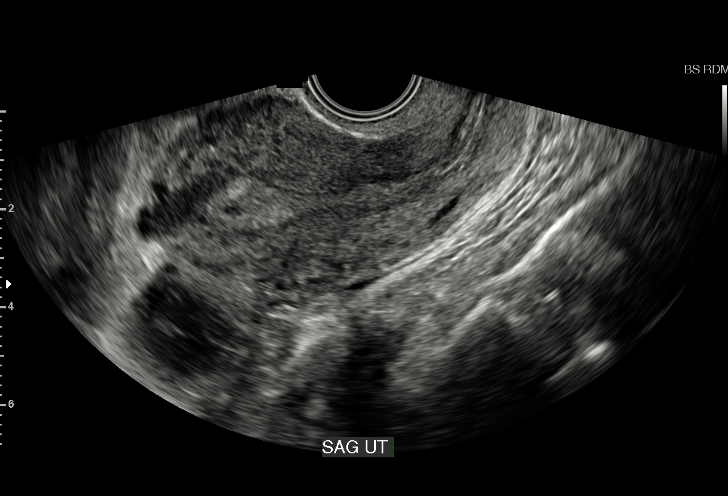

[15 of 28 positions shown; findings below may reference images not displayed]

FINDINGS: Intrauterine gestational sac: None

Yolk sac:  Not

Embryo:  No

Cardiac Activity: None

Heart Rate:   bpm

MSD:   mm    w     d

CRL:    mm    w    d                  US EDC:

Maternal uterus/adnexae: 2.5 cm complex left ovarian cyst, probably
corpus luteum. Normal right ovary. Trace free pelvic fluid.
IMPRESSION: No visible gestational sac.  Close follow-up is necessary.

## 2017-04-19 IMAGING — US US OB TRANSVAGINAL
1 series · 15 of 28 positions shown · non-contrast
Comparison: 09/09/2015

CLINICAL DATA: 24-year-old female with positive pregnancy test,
pelvic pain and bleeding. Beta HCG of 4,123

EXAM:
OBSTETRIC <14 WK US AND TRANSVAGINAL OB US
TECHNIQUE: Both transabdominal and transvaginal ultrasound examinations were
performed for complete evaluation of the gestation as well as the
maternal uterus, adnexal regions, and pelvic cul-de-sac.
Transvaginal technique was performed to assess early pregnancy.

[Series 1: us ob transvaginal · 15 of 61 slices shown]
[im 1/61]
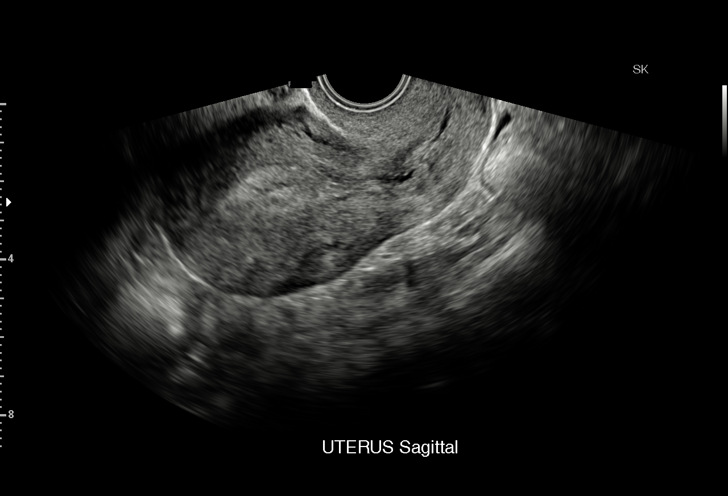
[im 5/61]
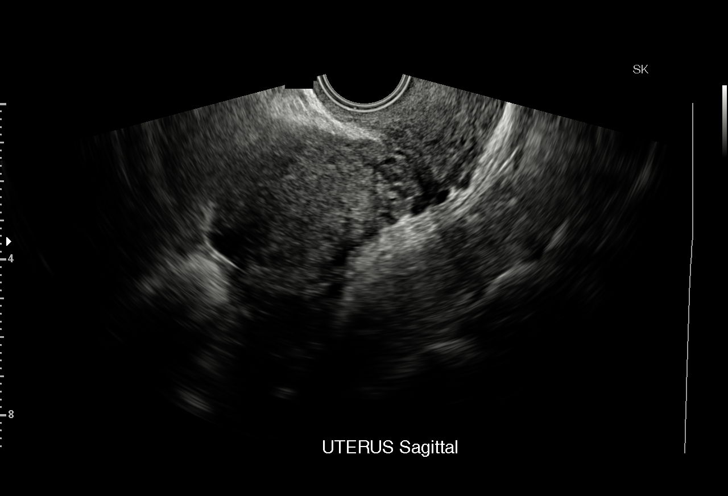
[im 9/61]
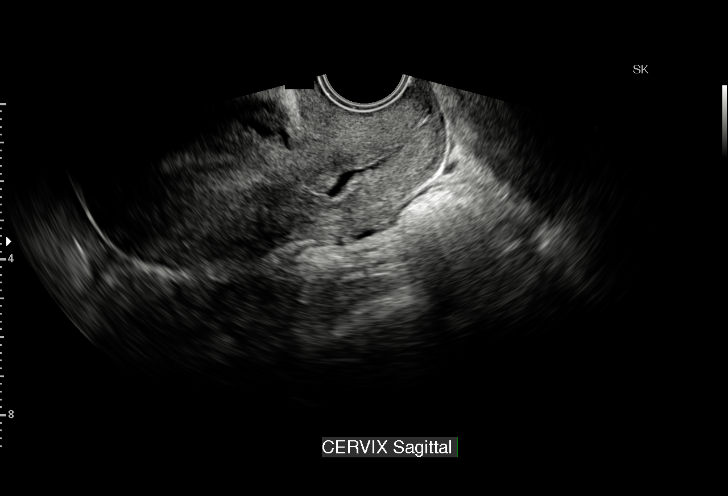
[im 14/61]
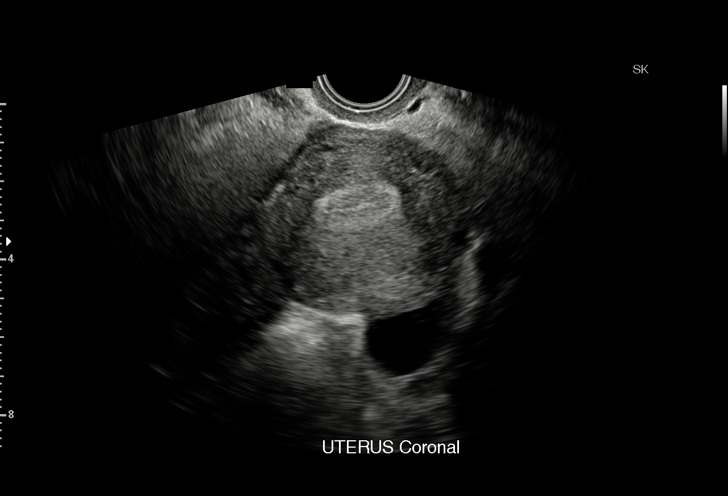
[im 18/61]
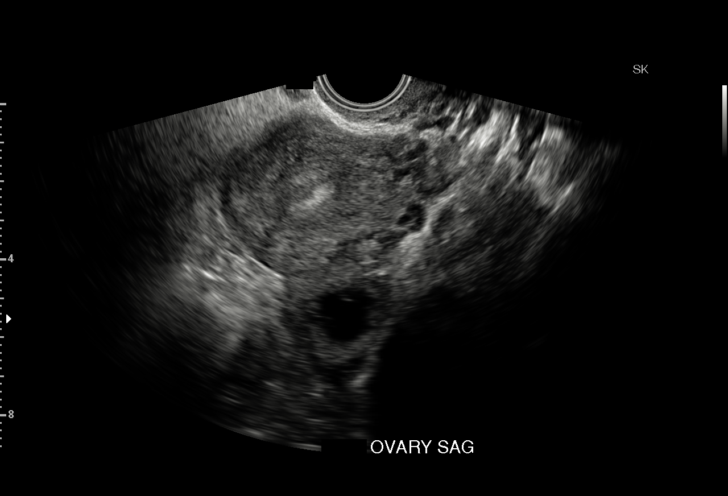
[im 23/61]
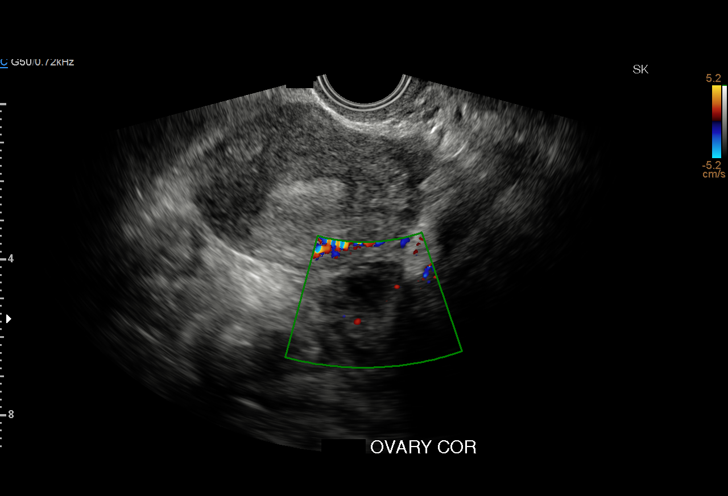
[im 27/61]
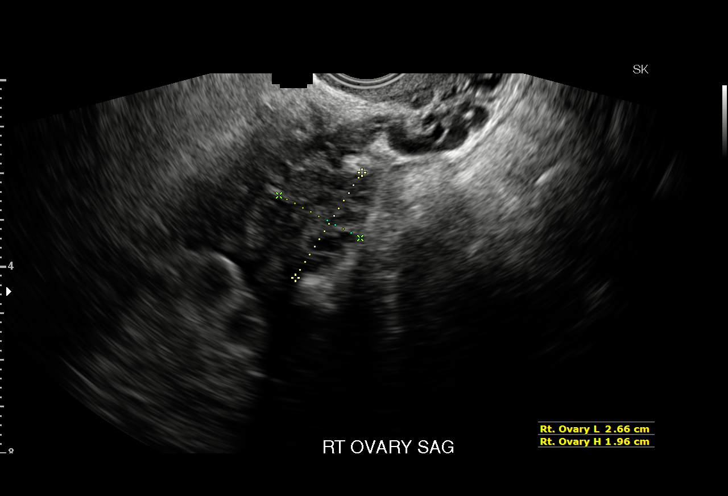
[im 32/61]
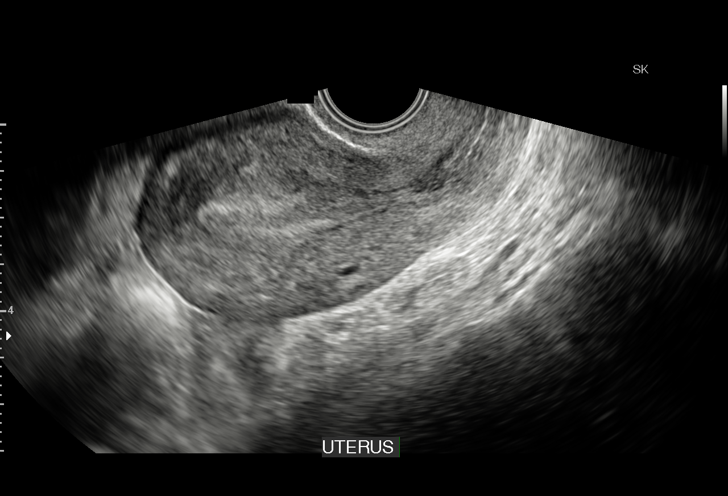
[im 34/61]
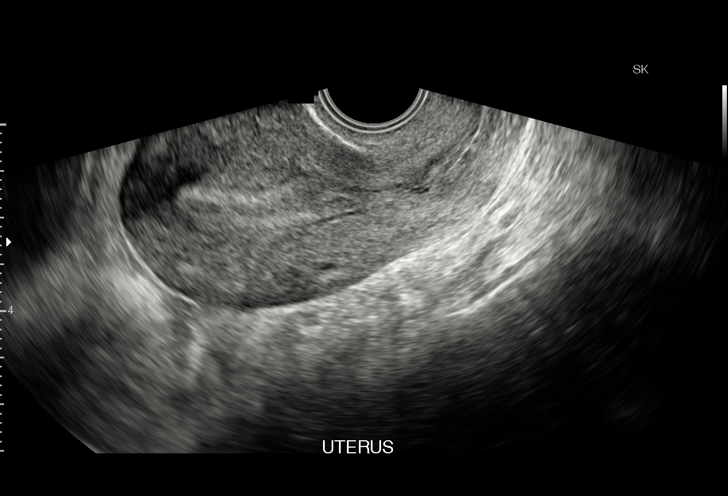
[im 38/61]
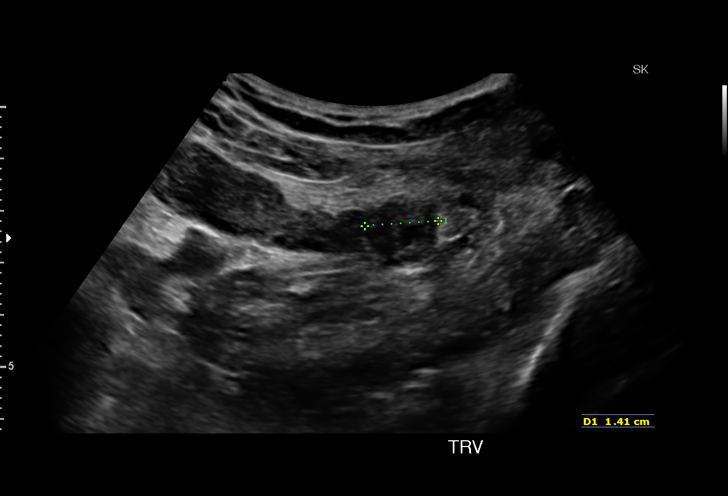
[im 43/61]
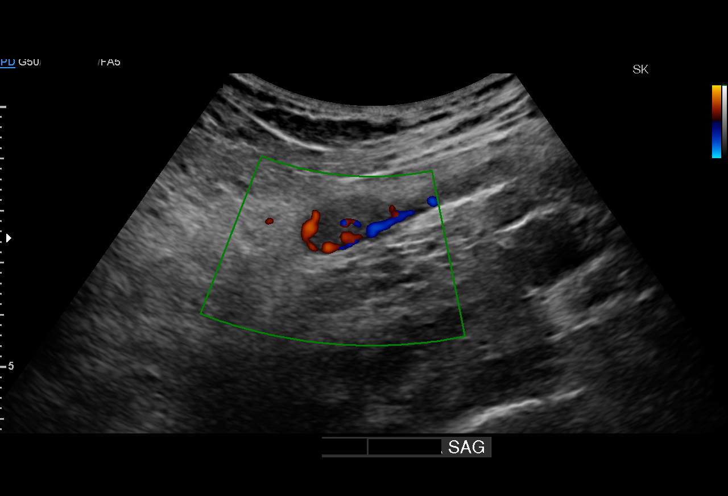
[im 47/61]
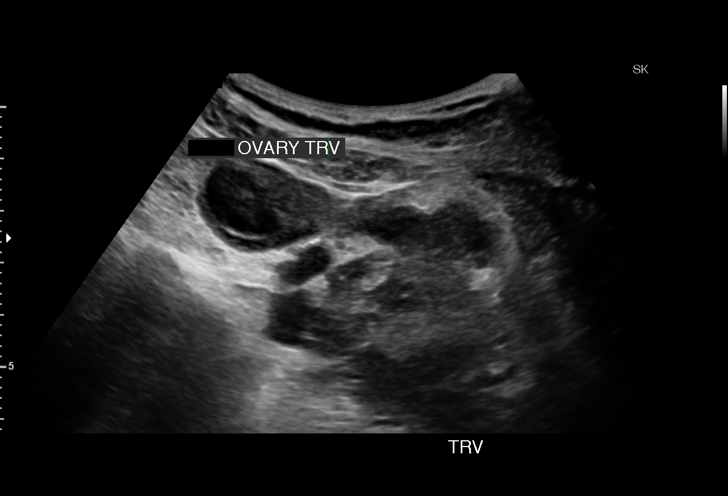
[im 52/61]
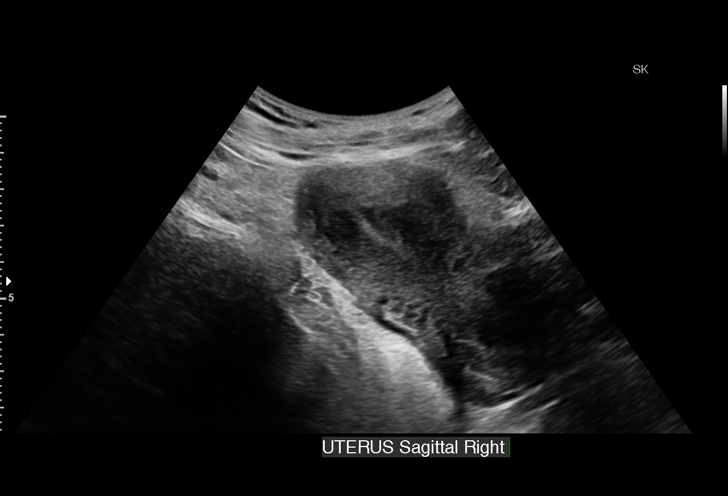
[im 56/61]
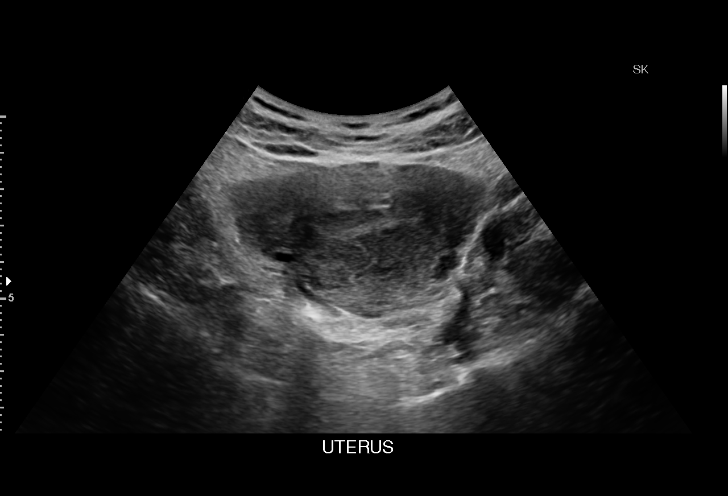
[im 61/61]
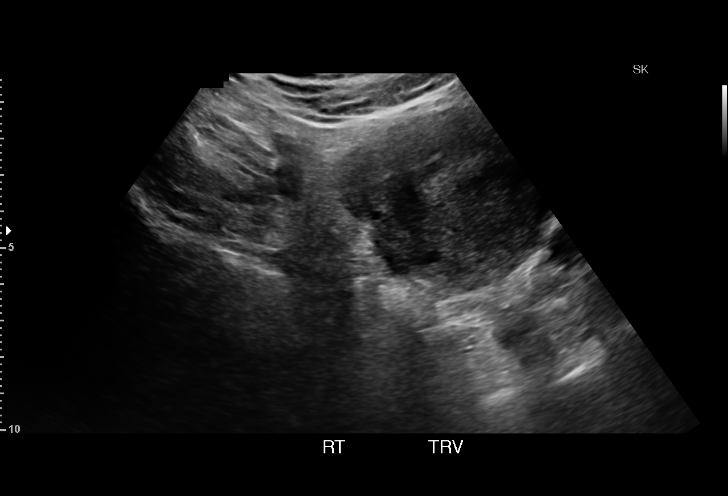

[15 of 28 positions shown; findings below may reference images not displayed]

FINDINGS: Intrauterine gestational sac: Not visualized

Yolk sac:  Not visualized

Embryo:  Not visualized

Maternal uterus/adnexae: A 2.3 x 1.2 x 1.4 cm left adnexal mass with
increased flow is highly suspicious for ectopic pregnancy.

Right ovary is unremarkable.

A 2 cm left ovarian cyst is present.

There is no evidence of free fluid.
IMPRESSION: 2.3 x 1.2 x 1.4 cm left adnexal mass without evidence of
intrauterine pregnancy- highly suspicious for ectopic pregnancy. No
evidence of free fluid.

2 cm left ovarian cyst.

Critical Value/emergent results were called by telephone at the time
of interpretation on 09/12/2015 at [DATE] to Dr. NAZARETH JUMPER ,
who verbally acknowledged these results.

## 2017-04-24 IMAGING — US US OB COMP LESS 14 WK
1 series · 15 of 28 positions shown · non-contrast
Comparison: Obstetric ultrasound 09/12/2015

CLINICAL DATA: Known left adnexal ectopic pregnancy, now with right
lower quadrant pain and vaginal bleeding. Status post methotrexate.

EXAM:
OBSTETRIC <14 WK ULTRASOUND
TECHNIQUE: Transabdominal ultrasound was performed for evaluation of the
gestation as well as the maternal uterus and adnexal regions.

[Series 1: us ob comp less 14 wk · 15 of 34 slices shown]
[im 1/34]
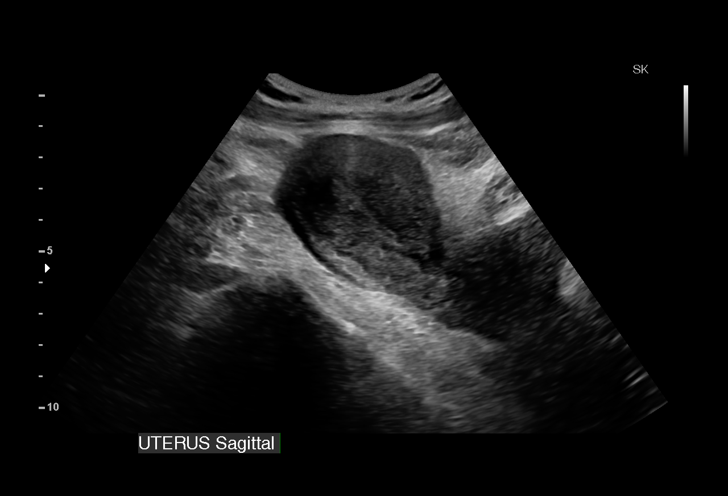
[im 3/34]
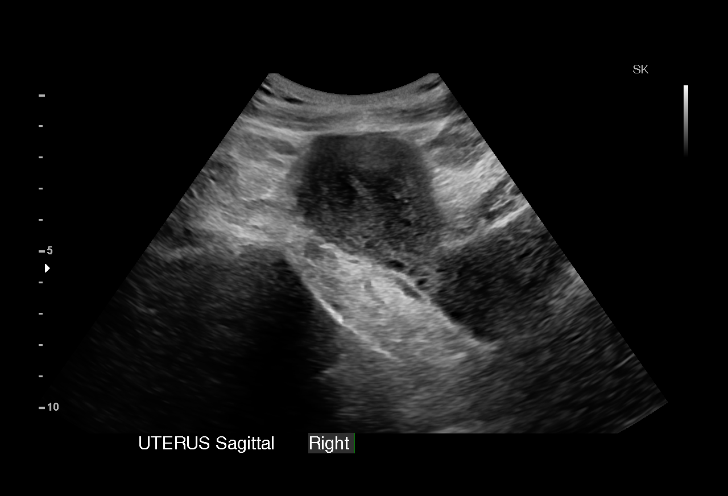
[im 5/34]
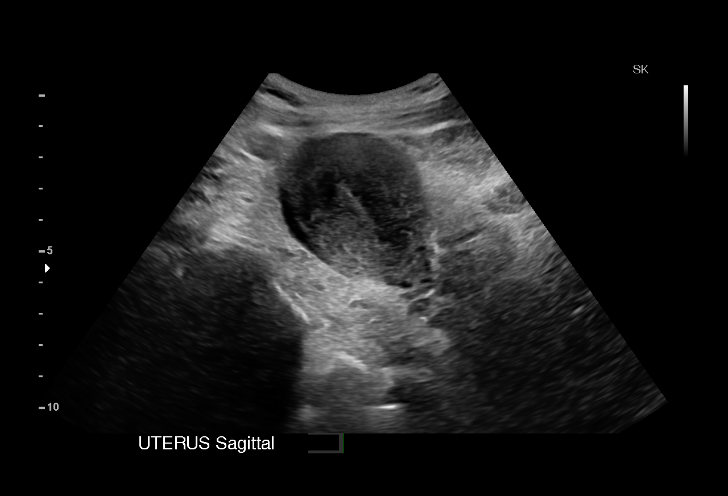
[im 8/34]
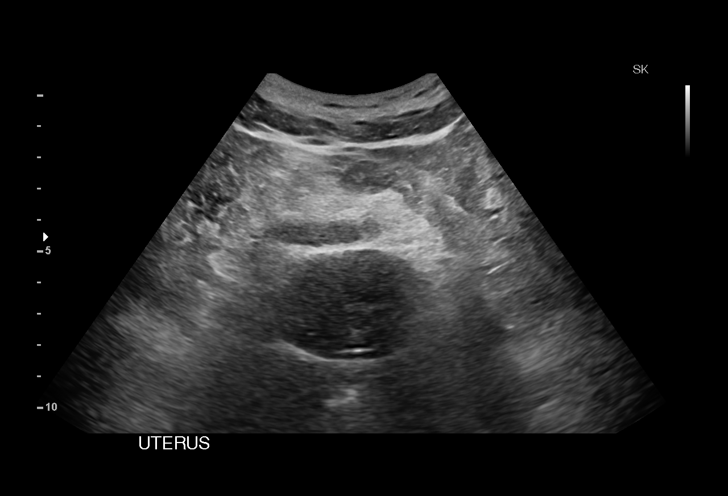
[im 10/34]
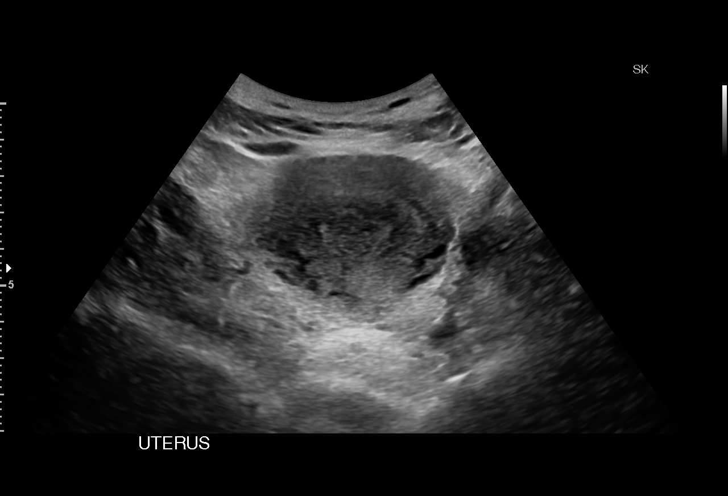
[im 13/34]
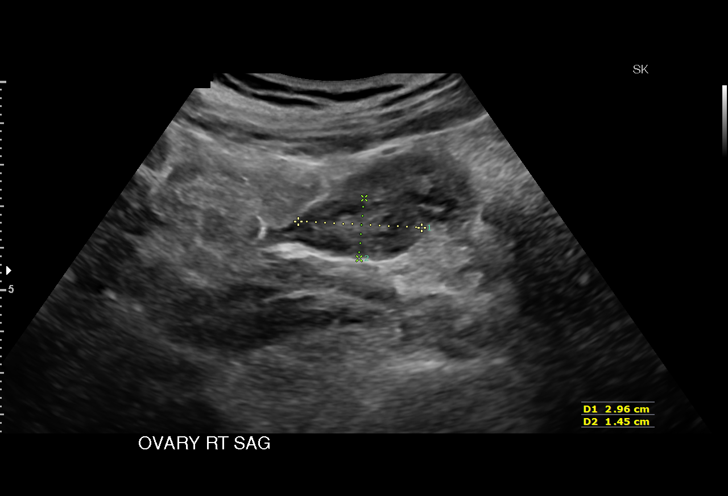
[im 15/34]
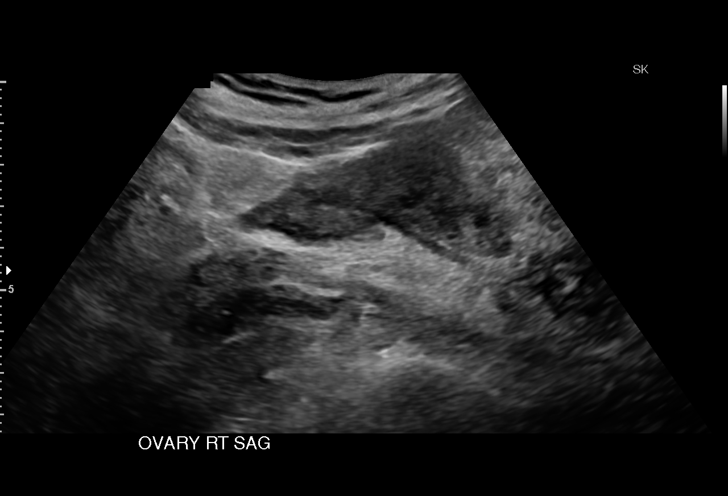
[im 18/34]
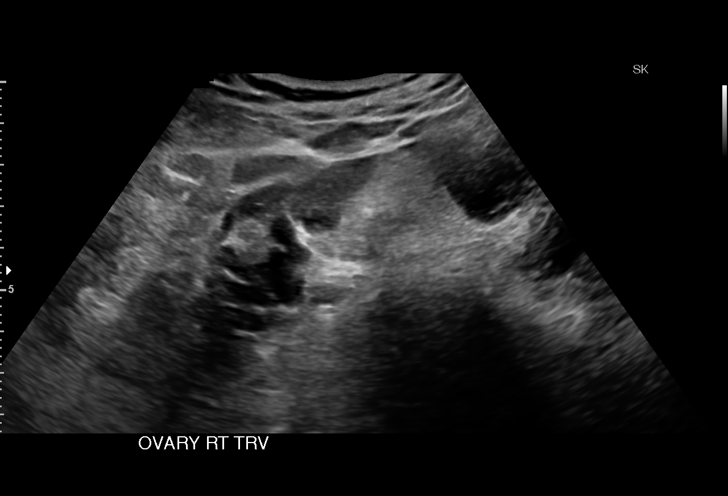
[im 19/34]
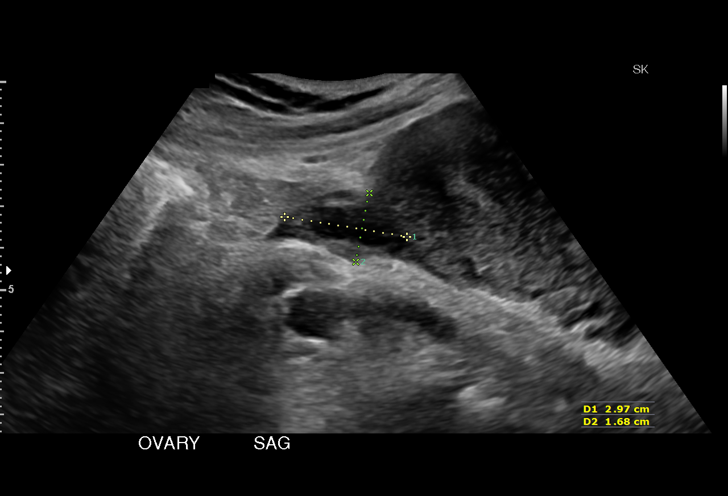
[im 21/34]
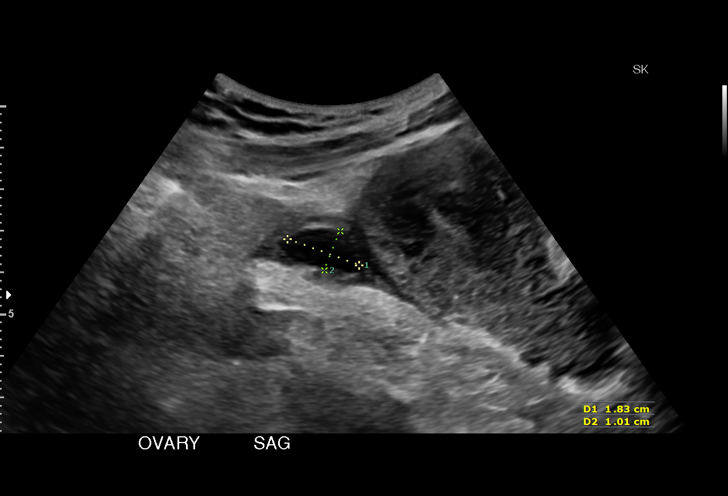
[im 24/34]
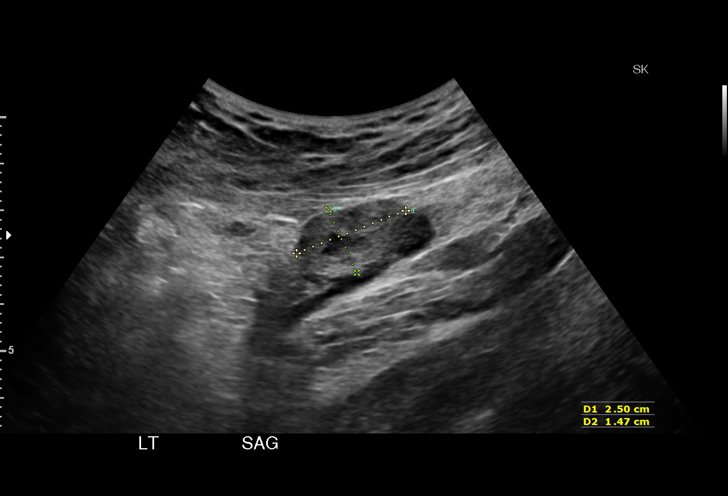
[im 26/34]
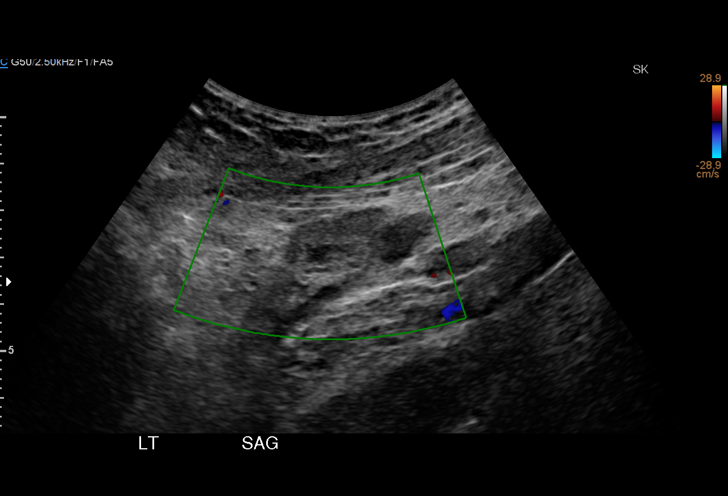
[im 29/34]
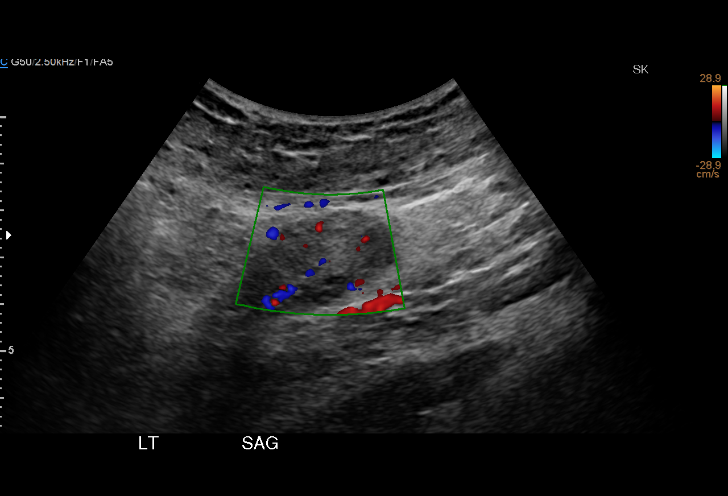
[im 31/34]
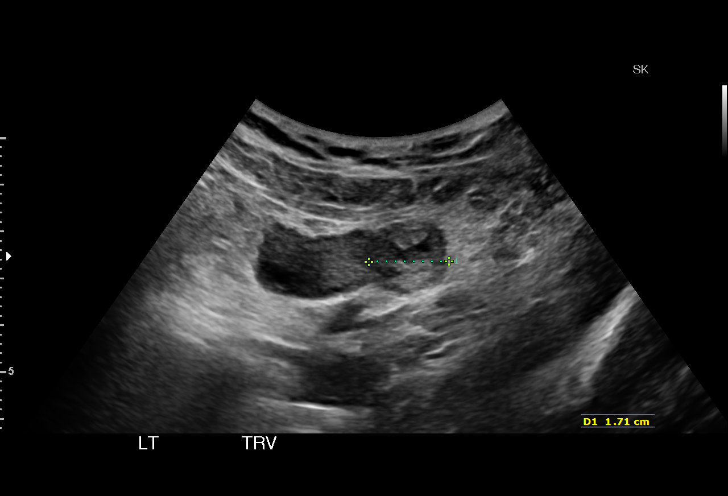
[im 34/34]
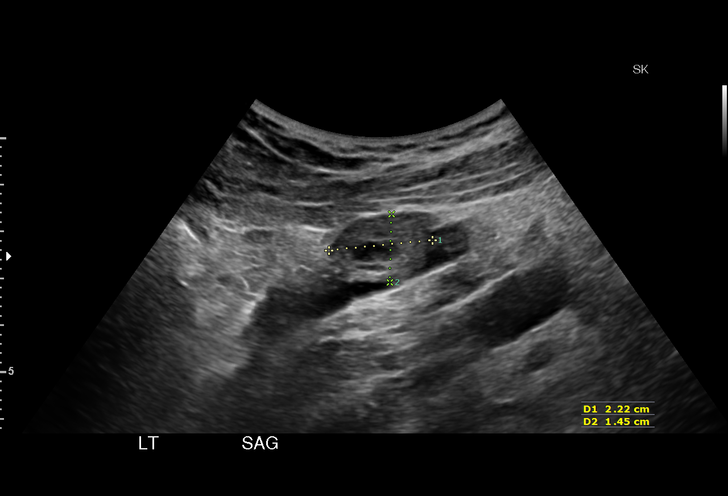

[15 of 28 positions shown; findings below may reference images not displayed]

FINDINGS: Intrauterine gestational sac: Not present.

Yolk sac:  Not present.

Embryo:  Not present.

Maternal uterus/adnexae: The uterus appears normal, the endometrium
is not well-defined. In the left adnexa adjacent to the left ovary
is a complex mass measuring 2.5 x 1.5 x 1.7 cm, most concerning for
ectopic pregnancy. This is unchanged compared to prior exam. There
is no surrounding free fluid. The adjacent left ovary measures 2.5 x
1.5 x 2.2 cm and contains a 1.8 cm corpus luteal cyst. Right ovary
appears normal measuring 3.0 x 1.5 x 1.3 cm.
IMPRESSION: Complex left adnexal lesion adjacent to the left ovary concerning
for ectopic pregnancy, unchanged in appearance compared to recent
prior exam. There is no surrounding free fluid or findings of
hemorrhage.

## 2017-10-03 IMAGING — US US OB COMP LESS 14 WK
1 series · 15 of 19 positions shown · non-contrast
Comparison: None.

CLINICAL DATA: Abdominal pain and spotting in first trimester
pregnancy

EXAM:
OBSTETRIC <14 WK ULTRASOUND
TECHNIQUE: Transabdominal ultrasound was performed for evaluation of the
gestation as well as the maternal uterus and adnexal regions.

[Series 1: us ob comp less 14 wk · 15 of 19 slices shown]
[im 1/19]
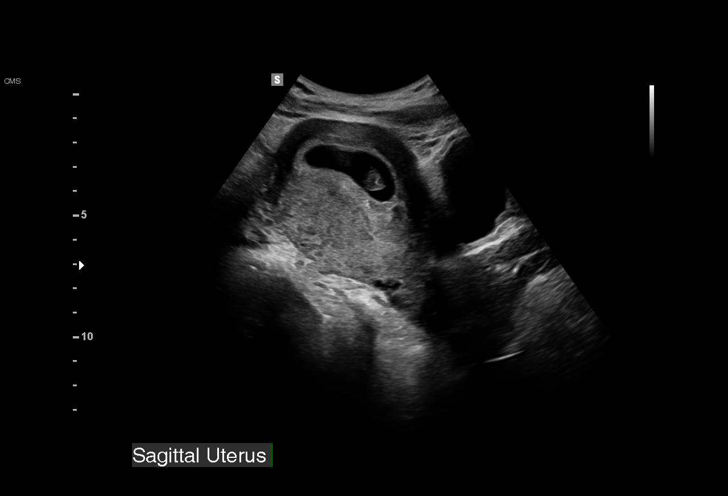
[im 2/19]
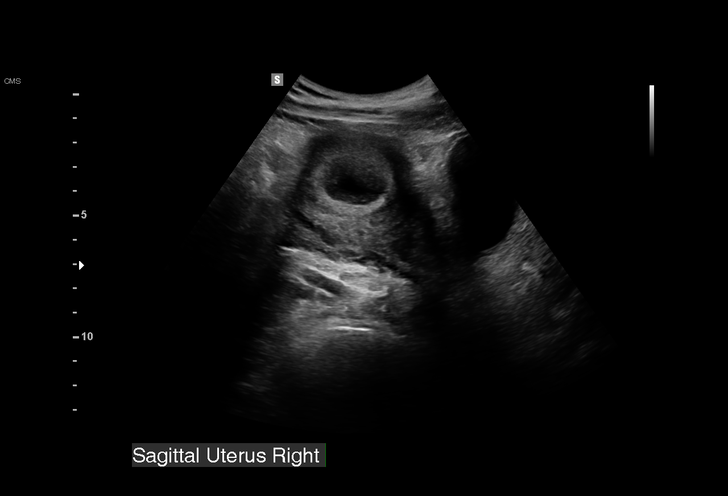
[im 4/19]
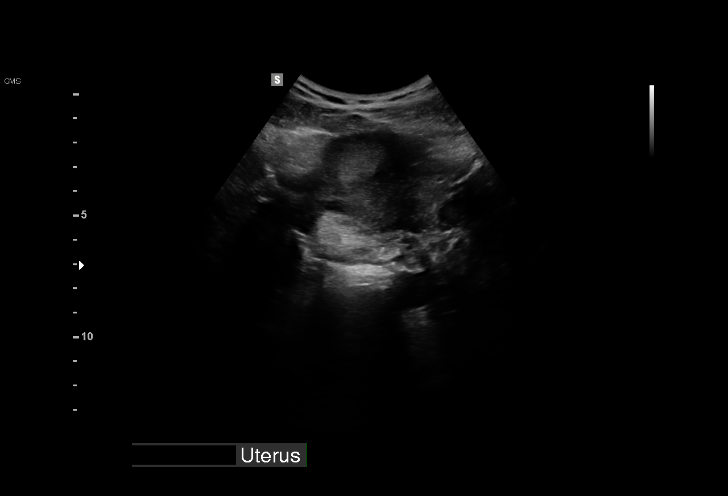
[im 5/19]
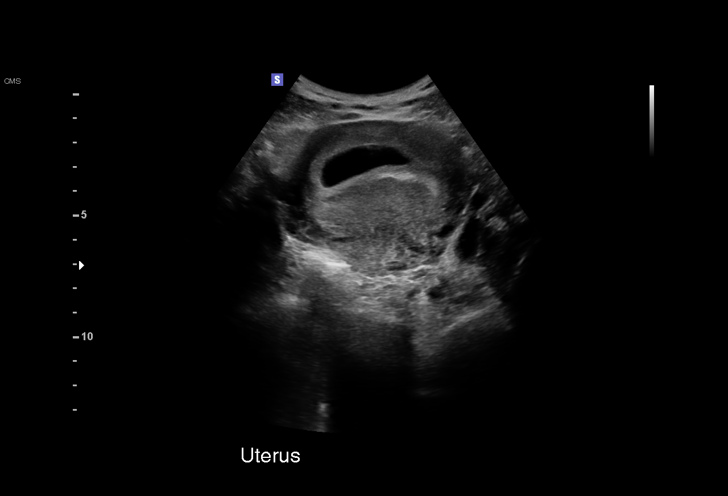
[im 6/19]
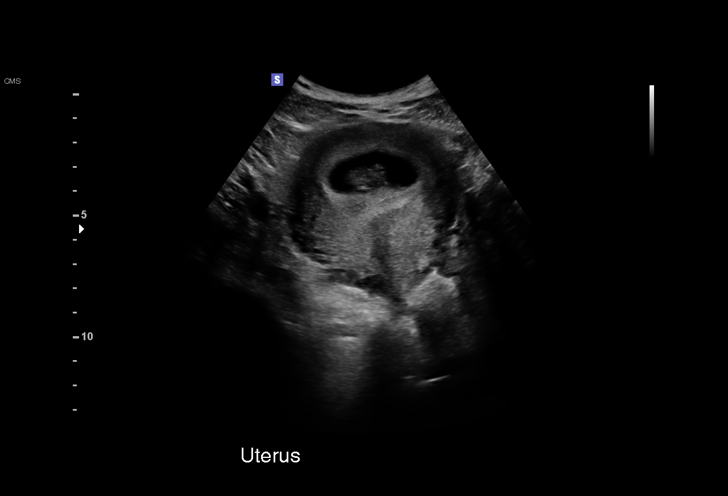
[im 7/19]
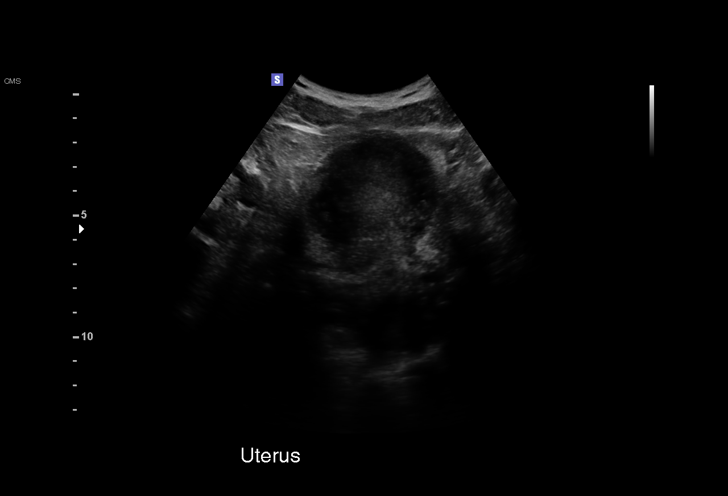
[im 9/19]
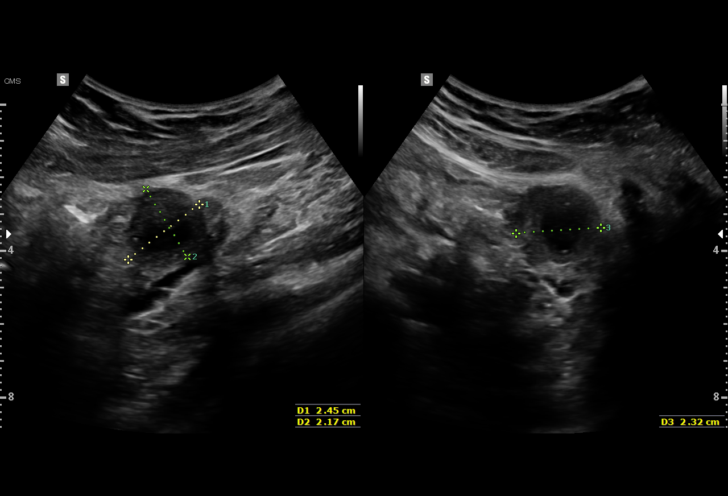
[im 10/19]
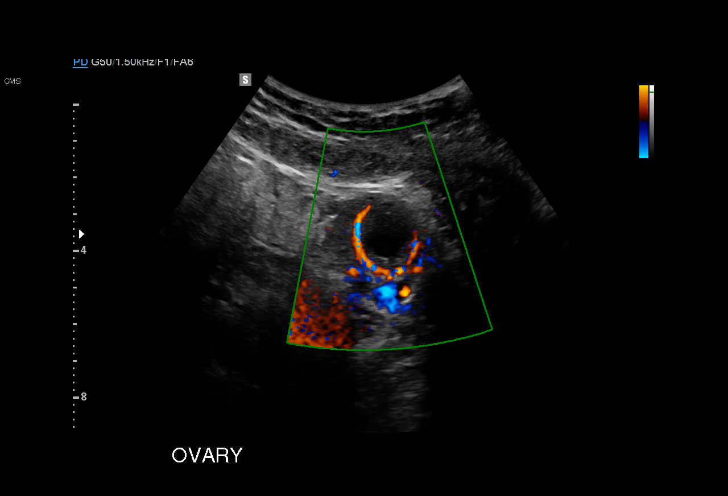
[im 11/19]
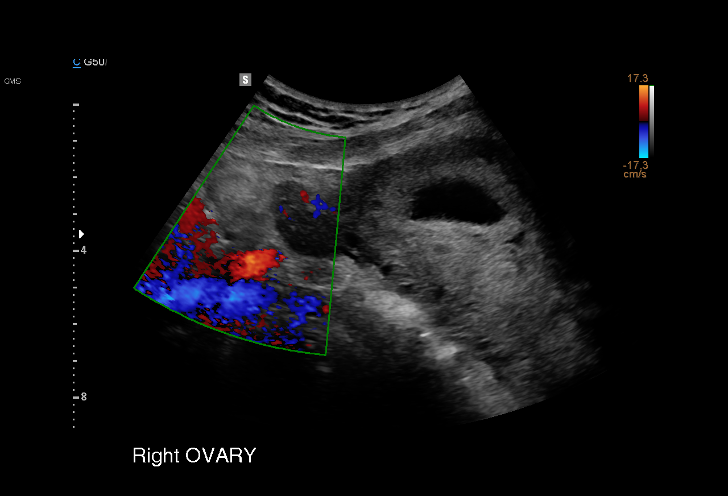
[im 13/19]
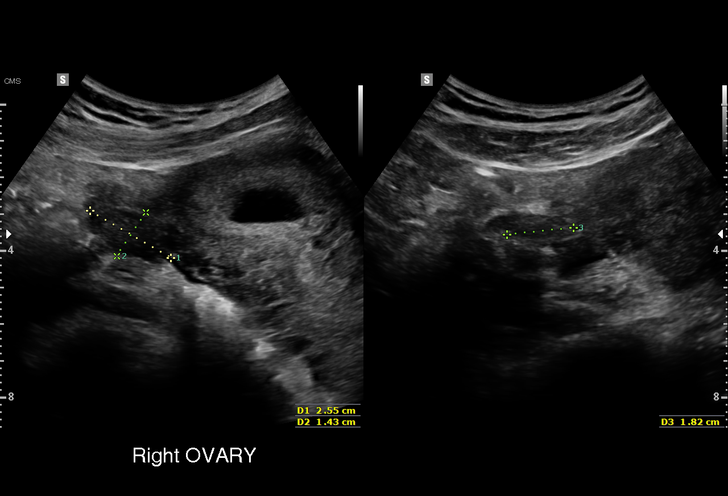
[im 14/19]
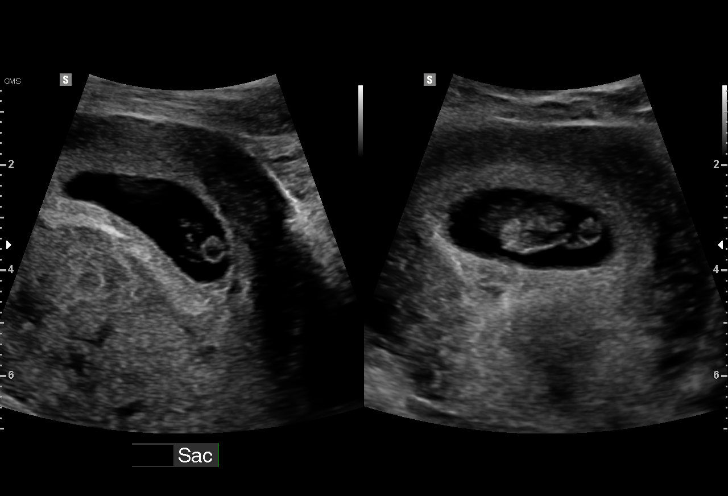
[im 15/19]
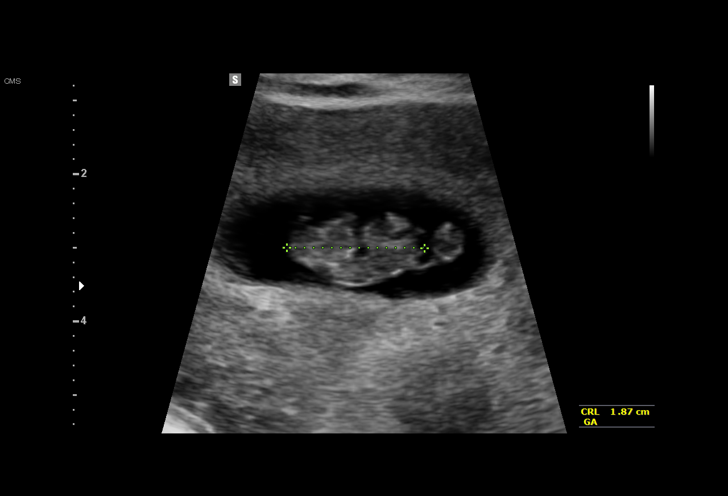
[im 16/19]
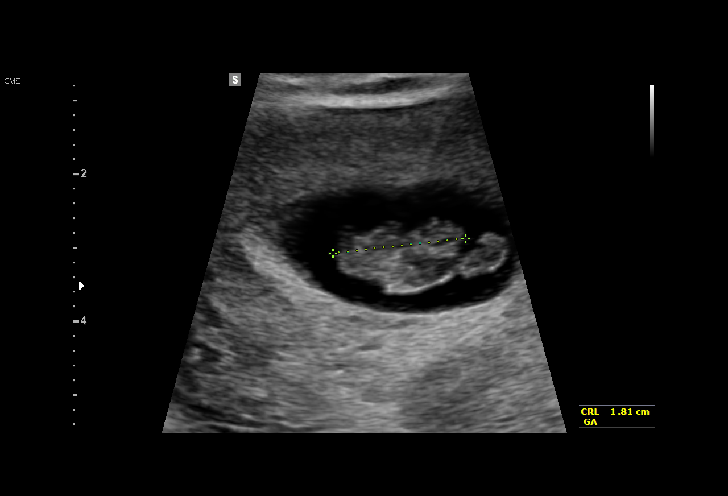
[im 18/19]
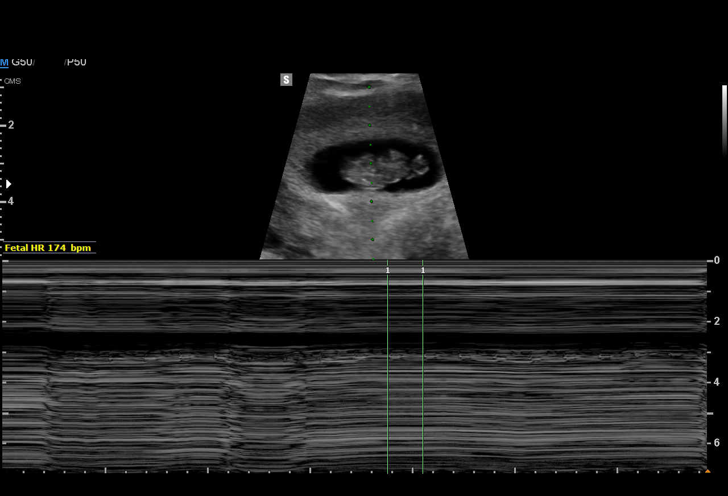
[im 19/19]
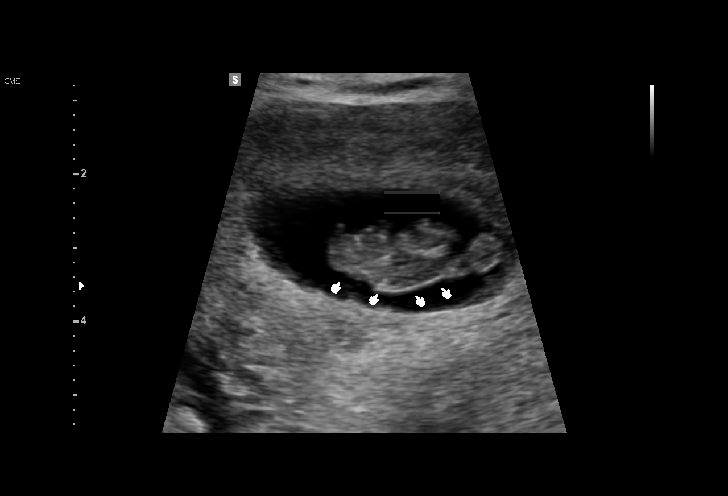

[15 of 19 positions shown; findings below may reference images not displayed]

FINDINGS: Intrauterine gestational sac: Visualized/normal in shape.

Yolk sac:  Present

Embryo:  Present

Cardiac Activity: Present

Heart Rate: 174 bpm

CRL:   18  mm   8 w 2 d                  US EDC: 10/05/2016

Subchorionic hemorrhage:  None visualized.

Maternal uterus/adnexae: Ovaries appear normal.  No free fluid.
IMPRESSION: Live intrauterine gestation on transabdominal scanning. Patient
declined transvaginal.
